# Patient Record
Sex: Female | Born: 1990
Health system: Southern US, Community
[De-identification: ages and names within clinical notes are randomized; demographics above are authoritative.]

## PROBLEM LIST (undated history)

## (undated) DIAGNOSIS — D649 Anemia, unspecified: Secondary | ICD-10-CM

## (undated) DIAGNOSIS — N840 Polyp of corpus uteri: Secondary | ICD-10-CM

## (undated) DIAGNOSIS — N921 Excessive and frequent menstruation with irregular cycle: Secondary | ICD-10-CM

## (undated) HISTORY — DX: Anemia, unspecified: D64.9

## (undated) HISTORY — PX: TONSILLECTOMY: SUR1361

---
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
## 2015-09-21 DIAGNOSIS — R1013 Epigastric pain: Secondary | ICD-10-CM | POA: Insufficient documentation

## 2015-09-21 DIAGNOSIS — R140 Abdominal distension (gaseous): Secondary | ICD-10-CM | POA: Insufficient documentation

## 2015-09-21 DIAGNOSIS — N920 Excessive and frequent menstruation with regular cycle: Secondary | ICD-10-CM | POA: Insufficient documentation

## 2016-04-23 ENCOUNTER — Ambulatory Visit: Payer: Self-pay | Admitting: Gynecology

## 2016-04-28 ENCOUNTER — Ambulatory Visit (INDEPENDENT_AMBULATORY_CARE_PROVIDER_SITE_OTHER): Payer: BLUE CROSS/BLUE SHIELD | Admitting: Gynecology

## 2016-04-28 ENCOUNTER — Encounter: Payer: Self-pay | Admitting: Gynecology

## 2016-04-28 VITALS — BP 116/72 | Ht 61.0 in | Wt 156.0 lb

## 2016-04-28 DIAGNOSIS — N920 Excessive and frequent menstruation with regular cycle: Secondary | ICD-10-CM | POA: Diagnosis not present

## 2016-04-28 DIAGNOSIS — N760 Acute vaginitis: Secondary | ICD-10-CM | POA: Diagnosis not present

## 2016-04-28 DIAGNOSIS — N898 Other specified noninflammatory disorders of vagina: Secondary | ICD-10-CM | POA: Diagnosis not present

## 2016-04-28 DIAGNOSIS — B9689 Other specified bacterial agents as the cause of diseases classified elsewhere: Secondary | ICD-10-CM

## 2016-04-28 DIAGNOSIS — Z113 Encounter for screening for infections with a predominantly sexual mode of transmission: Secondary | ICD-10-CM

## 2016-04-28 DIAGNOSIS — N923 Ovulation bleeding: Secondary | ICD-10-CM | POA: Insufficient documentation

## 2016-04-28 DIAGNOSIS — Z01411 Encounter for gynecological examination (general) (routine) with abnormal findings: Secondary | ICD-10-CM | POA: Diagnosis not present

## 2016-04-28 DIAGNOSIS — Z23 Encounter for immunization: Secondary | ICD-10-CM

## 2016-04-28 LAB — CBC WITH DIFFERENTIAL/PLATELET
BASOS PCT: 0 %
Basophils Absolute: 0 cells/uL (ref 0–200)
EOS PCT: 5 %
Eosinophils Absolute: 375 cells/uL (ref 15–500)
HCT: 35.3 % (ref 35.0–45.0)
Hemoglobin: 11.4 g/dL — ABNORMAL LOW (ref 11.7–15.5)
Lymphocytes Relative: 30 %
Lymphs Abs: 2250 cells/uL (ref 850–3900)
MCH: 25.2 pg — ABNORMAL LOW (ref 27.0–33.0)
MCHC: 32.3 g/dL (ref 32.0–36.0)
MCV: 77.9 fL — ABNORMAL LOW (ref 80.0–100.0)
MONOS PCT: 9 %
MPV: 9.4 fL (ref 7.5–12.5)
Monocytes Absolute: 675 cells/uL (ref 200–950)
NEUTROS ABS: 4200 {cells}/uL (ref 1500–7800)
Neutrophils Relative %: 56 %
PLATELETS: 405 10*3/uL — AB (ref 140–400)
RBC: 4.53 MIL/uL (ref 3.80–5.10)
RDW: 15.2 % — ABNORMAL HIGH (ref 11.0–15.0)
WBC: 7.5 10*3/uL (ref 3.8–10.8)

## 2016-04-28 LAB — WET PREP FOR TRICH, YEAST, CLUE
Trich, Wet Prep: NONE SEEN
YEAST WET PREP: NONE SEEN

## 2016-04-28 LAB — PREGNANCY, URINE: PREG TEST UR: NEGATIVE

## 2016-04-28 LAB — CHOLESTEROL, TOTAL: CHOLESTEROL: 196 mg/dL (ref 125–200)

## 2016-04-28 MED ORDER — TINIDAZOLE 500 MG PO TABS: ORAL_TABLET | ORAL | 0 refills | Status: DC

## 2016-04-28 NOTE — Patient Instructions (Addendum)
Influenza Virus Vaccine (Flucelvax) What is this medicine? INFLUENZA VIRUS VACCINE (in floo EN zuh VAHY ruhs vak SEEN) helps to reduce the risk of getting influenza also known as the flu. The vaccine only helps protect you against some strains of the flu. This medicine may be used for other purposes; ask your health care provider or pharmacist if you have questions. What should I tell my health care provider before I take this medicine? They need to know if you have any of these conditions: -bleeding disorder like hemophilia -fever or infection -Guillain-Barre syndrome or other neurological problems -immune system problems -infection with the human immunodeficiency virus (HIV) or AIDS -low blood platelet counts -multiple sclerosis -an unusual or allergic reaction to influenza virus vaccine, other medicines, foods, dyes or preservatives -pregnant or trying to get pregnant -breast-feeding How should I use this medicine? This vaccine is for injection into a muscle. It is given by a health care professional. A copy of Vaccine Information Statements will be given before each vaccination. Read this sheet carefully each time. The sheet may change frequently. Talk to your pediatrician regarding the use of this medicine in children. Special care may be needed. Overdosage: If you think you've taken too much of this medicine contact a poison control center or emergency room at once. Overdosage: If you think you have taken too much of this medicine contact a poison control center or emergency room at once. NOTE: This medicine is only for you. Do not share this medicine with others. What if I miss a dose? This does not apply. What may interact with this medicine? -chemotherapy or radiation therapy -medicines that lower your immune system like etanercept, anakinra, infliximab, and adalimumab -medicines that treat or prevent blood clots like warfarin -phenytoin -steroid medicines like prednisone or  cortisone -theophylline -vaccines This list may not describe all possible interactions. Give your health care provider a list of all the medicines, herbs, non-prescription drugs, or dietary supplements you use. Also tell them if you smoke, drink alcohol, or use illegal drugs. Some items may interact with your medicine. What should I watch for while using this medicine? Report any side effects that do not go away within 3 days to your doctor or health care professional. Call your health care provider if any unusual symptoms occur within 6 weeks of receiving this vaccine. You may still catch the flu, but the illness is not usually as bad. You cannot get the flu from the vaccine. The vaccine will not protect against colds or other illnesses that may cause fever. The vaccine is needed every year. What side effects may I notice from receiving this medicine? Side effects that you should report to your doctor or health care professional as soon as possible: -allergic reactions like skin rash, itching or hives, swelling of the face, lips, or tongue Side effects that usually do not require medical attention (Report these to your doctor or health care professional if they continue or are bothersome.): -fever -headache -muscle aches and pains -pain, tenderness, redness, or swelling at the injection site -tiredness This list may not describe all possible side effects. Call your doctor for medical advice about side effects. You may report side effects to FDA at 1-800-FDA-1088. Where should I keep my medicine? The vaccine will be given by a health care professional in a clinic, pharmacy, doctor's office, or other health care setting. You will not be given vaccine doses to store at home. NOTE: This sheet is a summary. It may not cover   all possible information. If you have questions about this medicine, talk to your doctor, pharmacist, or health care provider.    2016, Elsevier/Gold Standard. (2011-06-11  14:06:47)  Tinidazole tablets What is this medicine? TINIDAZOLE (tye NI da zole) is an antiinfective. It is used to treat amebiasis, giardiasis, trichomoniasis, and vaginosis. It will not work for colds, flu, or other viral infections. This medicine may be used for other purposes; ask your health care provider or pharmacist if you have questions. What should I tell my health care provider before I take this medicine? They need to know if you have any of these conditions: -anemia or other blood disorders -if you frequently drink alcohol containing drinks -receiving hemodialysis -seizure disorder -an unusual or allergic reaction to tinidazole, other medicines, foods, dyes, or preservatives -pregnant or trying to get pregnant -breast-feeding How should I use this medicine? Take this medicine by mouth with a full glass of water. Follow the directions on the prescription label. Take with food. Take your medicine at regular intervals. Do not take your medicine more often than directed. Take all of your medicine as directed even if you think you are better. Do not skip doses or stop your medicine early. Talk to your pediatrician regarding the use of this medicine in children. While this drug may be prescribed for children as young as 17 years of age for selected conditions, precautions do apply. Overdosage: If you think you have taken too much of this medicine contact a poison control center or emergency room at once. NOTE: This medicine is only for you. Do not share this medicine with others. What if I miss a dose? If you miss a dose, take it as soon as you can. If it is almost time for your next dose, take only that dose. Do not take double or extra doses. What may interact with this medicine? Do not take this medicine with any of the following medications: -alcohol or any product that contains alcohol -amprenavir oral solution -disulfiram -paclitaxel injection -ritonavir oral  solution -sertraline oral solution -sulfamethoxazole-trimethoprim injection This medicine may also interact with the following medications: -cholestyramine -cimetidine -conivaptan -cyclosporin -fluorouracil -fosphenytoin, phenytoin -ketoconazole -lithium -phenobarbital -tacrolimus -warfarin This list may not describe all possible interactions. Give your health care provider a list of all the medicines, herbs, non-prescription drugs, or dietary supplements you use. Also tell them if you smoke, drink alcohol, or use illegal drugs. Some items may interact with your medicine. What should I watch for while using this medicine? Tell your doctor or health care professional if your symptoms do not improve or if they get worse. Avoid alcoholic drinks while you are taking this medicine and for three days afterward. Alcohol may make you feel dizzy, sick, or flushed. If you are being treated for a sexually transmitted disease, avoid sexual contact until you have finished your treatment. Your sexual partner may also need treatment. What side effects may I notice from receiving this medicine? Side effects that you should report to your doctor or health care professional as soon as possible: -allergic reactions like skin rash, itching or hives, swelling of the face, lips, or tongue -breathing problems -confusion, depression -dark or white patches in the mouth -feeling faint or lightheaded, falls -fever, infection -numbness, tingling, pain or weakness in the hands or feet -pain when passing urine -seizures -unusually weak or tired -vaginal irritation or discharge -vomiting Side effects that usually do not require medical attention (report to your doctor or health care professional if they  continue or are bothersome): -dark brown or reddish urine -diarrhea -headache -loss of appetite -metallic taste -nausea -stomach upset This list may not describe all possible side effects. Call your doctor  for medical advice about side effects. You may report side effects to FDA at 1-800-FDA-1088. Where should I keep my medicine? Keep out of the reach of children. Store at room temperature between 15 and 30 degrees C (59 and 86 degrees F). Protect from light and moisture. Keep container tightly closed. Throw away any unused medicine after the expiration date. NOTE: This sheet is a summary. It may not cover all possible information. If you have questions about this medicine, talk to your doctor, pharmacist, or health care provider.    2016, Elsevier/Gold Standard. (2008-03-27 15:22:28)  Bacterial Vaginosis Bacterial vaginosis is a vaginal infection that occurs when the normal balance of bacteria in the vagina is disrupted. It results from an overgrowth of certain bacteria. This is the most common vaginal infection in women of childbearing age. Treatment is important to prevent complications, especially in pregnant women, as it can cause a premature delivery. CAUSES  Bacterial vaginosis is caused by an increase in harmful bacteria that are normally present in smaller amounts in the vagina. Several different kinds of bacteria can cause bacterial vaginosis. However, the reason that the condition develops is not fully understood. RISK FACTORS Certain activities or behaviors can put you at an increased risk of developing bacterial vaginosis, including:  Having a new sex partner or multiple sex partners.  Douching.  Using an intrauterine device (IUD) for contraception. Women do not get bacterial vaginosis from toilet seats, bedding, swimming pools, or contact with objects around them. SIGNS AND SYMPTOMS  Some women with bacterial vaginosis have no signs or symptoms. Common symptoms include:  Grey vaginal discharge.  A fishlike odor with discharge, especially after sexual intercourse.  Itching or burning of the vagina and vulva.  Burning or pain with urination. DIAGNOSIS  Your health care  provider will take a medical history and examine the vagina for signs of bacterial vaginosis. A sample of vaginal fluid may be taken. Your health care provider will look at this sample under a microscope to check for bacteria and abnormal cells. A vaginal pH test may also be done.  TREATMENT  Bacterial vaginosis may be treated with antibiotic medicines. These may be given in the form of a pill or a vaginal cream. A second round of antibiotics may be prescribed if the condition comes back after treatment. Because bacterial vaginosis increases your risk for sexually transmitted diseases, getting treated can help reduce your risk for chlamydia, gonorrhea, HIV, and herpes. HOME CARE INSTRUCTIONS   Only take over-the-counter or prescription medicines as directed by your health care provider.  If antibiotic medicine was prescribed, take it as directed. Make sure you finish it even if you start to feel better.  Tell all sexual partners that you have a vaginal infection. They should see their health care provider and be treated if they have problems, such as a mild rash or itching.  During treatment, it is important that you follow these instructions:  Avoid sexual activity or use condoms correctly.  Do not douche.  Avoid alcohol as directed by your health care provider.  Avoid breastfeeding as directed by your health care provider. SEEK MEDICAL CARE IF:   Your symptoms are not improving after 3 days of treatment.  You have increased discharge or pain.  You have a fever. MAKE SURE YOU:  Understand these instructions.  Will watch your condition.  Will get help right away if you are not doing well or get worse. FOR MORE INFORMATION  Centers for Disease Control and Prevention, Division of STD Prevention: [employer]raiser[employer]raud[employer] American Sexual Health Association (ASHA): www.ashastd.org    This information is not intended to replace advice given to you by your health care provider. Make sure you  discuss any questions you have with your health care provider.   Document Released: 06/30/2005 Document Revised: 07/21/2014 Document Reviewed: 02/09/2013 Elsevier Interactive Patient Education [employer].

## 2016-04-28 NOTE — Progress Notes (Signed)
Dana Manning 1990/09/18 [id number]   History:    25 y.o.  for annual gyn exam who is a new patient to the practice. Patient stated that she was being followed in the past and another practice. She has been complaining of intermenstrual bleeding. She has been using only condoms for contraception. She has not been sexually active in over a month. She denies any nausea, vomiting, slight breast tenderness. Patient had a new partner month ago but not sexually active would like to have an STD screen. She does state she completed many years ago the HPV vaccine. Patient reports no past history of any abnormal Pap smear.  Past medical history,surgical history, family history and social history were all reviewed and documented in the EPIC chart.  Gynecologic History [name] was 04/01/2016. Contraception: condoms Last Pap: Several years ago no records. Results were: normal Last mammogram: Not indicated. Results were: Not indicated  Obstetric History OB History  Gravida Para Term Preterm AB Living  0 0 0 0 0 0  SAB TAB Ectopic Multiple Live Births  0 0 0 0 0         ROS: A ROS was performed and pertinent positives and negatives are included in the history.  GENERAL: No fevers or chills. HEENT: No change in vision, no earache, sore throat or sinus congestion. NECK: No pain or stiffness. CARDIOVASCULAR: No chest pain or pressure. No palpitations. PULMONARY: No shortness of breath, cough or wheeze. GASTROINTESTINAL: No abdominal pain, nausea, vomiting or diarrhea, melena or bright red blood per rectum. GENITOURINARY: No urinary frequency, urgency, hesitancy or dysuria. MUSCULOSKELETAL: No joint or muscle pain, no back pain, no recent trauma. DERMATOLOGIC: No rash, no itching, no lesions. ENDOCRINE: No polyuria, polydipsia, no heat or cold intolerance. No recent change in weight. HEMATOLOGICAL: No anemia or easy bruising or bleeding. NEUROLOGIC: No headache, seizures,  numbness, tingling or weakness. PSYCHIATRIC: No depression, no loss of interest in normal activity or change in sleep pattern.     Exam: chaperone present  BP 116/72   Ht 5\' 1"  (1.549 m)   Wt 156 lb (70.8 kg)   LMP 04/01/2016   BMI 29.48 kg/m   Body mass index is 29.48 kg/m.  General appearance : Well developed well nourished female. No acute distress HEENT: Eyes: no retinal hemorrhage or exudates,  Neck supple, trachea midline, no carotid bruits, no thyroidmegaly Lungs: Clear to auscultation, no rhonchi or wheezes, or rib retractions  Heart: Regular rate and rhythm, no murmurs or gallops Breast:Examined in sitting and supine position were symmetrical in appearance, no palpable masses or tenderness,  no skin retraction, no nipple inversion, no nipple discharge, no skin discoloration, no axillary or supraclavicular lymphadenopathy Abdomen: no palpable masses or tenderness, no rebound or guarding Extremities: no edema or skin discoloration or tenderness  Pelvic:  Bartholin, Urethra, Skene Glands: Within normal limits             Vagina: No gross lesions or discharge, thick white foul-smelling odor discharge  Cervix: No gross lesions or discharge  Uterus  anteverted, normal size, shape and consistency, non-tender and mobile  Adnexa  Without masses or tenderness  Anus and perineum  normal   Rectovaginal  normal sphincter tone without palpated masses or tenderness             Hemoccult not indicated   Wet prep: Few clue cells, too numerous to count white blood cell and many bacteria  GC committed culture pending  Urine  pregnancy test negative  Assessment/Plan:  25 y.o. female for annual exam with history of intermenstrual bleeding will be schedule sometime late next week for sonohysterogram. Clinical evidence of bacterial vaginosis will be treated with Tindamax 500 mg tablets. She was instructed to take 4 tablets today repeat in 24 hours. As part of her STD screen the following was  ordered: GC and Chlamydia culture, HIV, RPR, hepatitis B and C. Also a CBC screening cholesterol and urinalysis was obtained today. Patient was counseled and received the flu vaccine today.   Terrance Mass MD, 10:08 AM 04/28/2016

## 2016-04-29 ENCOUNTER — Encounter: Payer: Self-pay | Admitting: Gynecology

## 2016-04-29 ENCOUNTER — Telehealth: Payer: Self-pay | Admitting: *Deleted

## 2016-04-29 LAB — URINALYSIS W MICROSCOPIC + REFLEX CULTURE
BACTERIA UA: NONE SEEN [HPF]
BILIRUBIN URINE: NEGATIVE
Casts: NONE SEEN [LPF]
Crystals: NONE SEEN [HPF]
GLUCOSE, UA: NEGATIVE
KETONES UR: NEGATIVE
Nitrite: NEGATIVE
PROTEIN: NEGATIVE
Specific Gravity, Urine: 1.026 (ref 1.001–1.035)
Yeast: NONE SEEN [HPF]
pH: 6.5 (ref 5.0–8.0)

## 2016-04-29 LAB — PAP IG W/ RFLX HPV ASCU

## 2016-04-29 LAB — HEPATITIS B SURFACE ANTIGEN: HEP B S AG: NEGATIVE

## 2016-04-29 LAB — GC/CHLAMYDIA PROBE AMP
CT Probe RNA: NOT DETECTED
GC Probe RNA: NOT DETECTED

## 2016-04-29 LAB — HIV ANTIBODY (ROUTINE TESTING W REFLEX): HIV 1&2 Ab, 4th Generation: NONREACTIVE

## 2016-04-29 LAB — RPR

## 2016-04-29 LAB — HEPATITIS C ANTIBODY: HCV AB: NEGATIVE

## 2016-04-29 NOTE — Telephone Encounter (Signed)
Pt informed with normal STD screening on 04/28/16.

## 2016-04-30 ENCOUNTER — Telehealth: Payer: Self-pay | Admitting: *Deleted

## 2016-04-30 ENCOUNTER — Other Ambulatory Visit: Payer: Self-pay | Admitting: *Deleted

## 2016-04-30 DIAGNOSIS — N923 Ovulation bleeding: Secondary | ICD-10-CM

## 2016-04-30 LAB — URINE CULTURE

## 2016-04-30 NOTE — Telephone Encounter (Signed)
Per Mitzi Hansen at [facility] ref call [phone] [facility] covered with $5 copay KW

## 2016-05-01 ENCOUNTER — Ambulatory Visit: Payer: Self-pay | Admitting: Gynecology

## 2016-05-08 ENCOUNTER — Other Ambulatory Visit: Payer: Self-pay | Admitting: Gynecology

## 2016-05-08 DIAGNOSIS — N939 Abnormal uterine and vaginal bleeding, unspecified: Secondary | ICD-10-CM

## 2016-05-14 ENCOUNTER — Encounter: Payer: Self-pay | Admitting: Gynecology

## 2016-05-14 ENCOUNTER — Ambulatory Visit (INDEPENDENT_AMBULATORY_CARE_PROVIDER_SITE_OTHER): Payer: BLUE CROSS/BLUE SHIELD

## 2016-05-14 ENCOUNTER — Other Ambulatory Visit: Payer: Self-pay | Admitting: Gynecology

## 2016-05-14 ENCOUNTER — Ambulatory Visit (INDEPENDENT_AMBULATORY_CARE_PROVIDER_SITE_OTHER): Payer: BLUE CROSS/BLUE SHIELD | Admitting: Gynecology

## 2016-05-14 VITALS — BP 110/70 | HR 70

## 2016-05-14 DIAGNOSIS — N923 Ovulation bleeding: Secondary | ICD-10-CM

## 2016-05-14 DIAGNOSIS — R938 Abnormal findings on diagnostic imaging of other specified body structures: Secondary | ICD-10-CM

## 2016-05-14 DIAGNOSIS — D251 Intramural leiomyoma of uterus: Secondary | ICD-10-CM

## 2016-05-14 DIAGNOSIS — N840 Polyp of corpus uteri: Secondary | ICD-10-CM

## 2016-05-14 DIAGNOSIS — R9389 Abnormal findings on diagnostic imaging of other specified body structures: Secondary | ICD-10-CM

## 2016-05-14 DIAGNOSIS — N939 Abnormal uterine and vaginal bleeding, unspecified: Secondary | ICD-10-CM | POA: Diagnosis not present

## 2016-05-14 DIAGNOSIS — Z01818 Encounter for other preprocedural examination: Secondary | ICD-10-CM | POA: Diagnosis not present

## 2016-05-14 DIAGNOSIS — N920 Excessive and frequent menstruation with regular cycle: Secondary | ICD-10-CM

## 2016-05-14 DIAGNOSIS — N9489 Other specified conditions associated with female genital organs and menstrual cycle: Secondary | ICD-10-CM

## 2016-05-14 NOTE — Progress Notes (Signed)
Dana Manning is an 25 y.o. female gravida 0 para 0 that was seen for the first time as a new patient to the practice on 04/28/2016. Patient been complaining intermenstrual bleeding for several months. She was only using condoms for contraception to had not been sexually active for a month. She is complaining of slight vaginal discharge and was treated for bacterial vaginosis currently asymptomatic. Her GC and Chlamydia culture along with HIV, RPR, hepatitis B and C as part STD screening was negative. She is here today for preoperative consultation as well as sonohysterogram.  Ultrasound/sono hysterogram today: Uterus measured 8.7 x 6.3 x 4.4 cm within a major stripe of 9 mm. Prominent echogenic focus measuring 27 x 7 mm with positive color flow was noted. A small intramural fibroid measuring 12 x 9 mm was noted. Right and left ovary were normal. There was some fluid in the cul-de-sac. The cervix was cleansed Betadine solution a sterile catheter was introduced into the uterine cavity and a 16 mm anterior along with 14 x 5 mm posterior and 14 x 6 mm anterior endometrial polyps were noted.  Pertinent Gynecological History: Menses: Intermenstrual spotting Bleeding: intermenstrual bleeding Contraception: none DES exposure: unknown Blood transfusions: none Sexually transmitted diseases: Chlamydia Previous GYN Procedures: none  Last mammogram: Not indicated Date: Not indicated Last pap: normal Date: 2017 OB History: G 0, P 0   Menstrual History: Menarche age: 25 [name] was 05/06/2016.    Past Medical History:  Diagnosis Date  . Anemia     Past Surgical History:  Procedure Laterality Date  . APPENDECTOMY    . TONSILLECTOMY      Family History  Problem Relation Age of Onset  . Cancer Maternal Grandmother     SMALL INTESTINE-   . Cancer Maternal Grandfather     PROSTATE- LYMPHOMA    Social History:  reports that she has never smoked. She has never used  smokeless tobacco. She reports that she does not drink alcohol or use drugs.  Allergies: Not on File   (Not in a hospital admission)  REVIEW OF SYSTEMS: A ROS was performed and pertinent positives and negatives are included in the history.  GENERAL: No fevers or chills. HEENT: No change in vision, no earache, sore throat or sinus congestion. NECK: No pain or stiffness. CARDIOVASCULAR: No chest pain or pressure. No palpitations. PULMONARY: No shortness of breath, cough or wheeze. GASTROINTESTINAL: No abdominal pain, nausea, vomiting or diarrhea, melena or bright red blood per rectum. GENITOURINARY: No urinary frequency, urgency, hesitancy or dysuria. MUSCULOSKELETAL: No joint or muscle pain, no back pain, no recent trauma. DERMATOLOGIC: No rash, no itching, no lesions. ENDOCRINE: No polyuria, polydipsia, no heat or cold intolerance. No recent change in weight. HEMATOLOGICAL: No anemia or easy bruising or bleeding. NEUROLOGIC: No headache, seizures, numbness, tingling or weakness. PSYCHIATRIC: No depression, no loss of interest in normal activity or change in sleep pattern.     Last menstrual period 05/06/2016.  Physical Exam:  HEENT:unremarkable Neck:Supple, midline, no thyroid megaly, no carotid bruits Lungs:  Clear to auscultation no rhonchi's or wheezes Heart:Regular rate and rhythm, no murmurs or gallops Breast Exam: Exam and on 05/08/2016 see previous note Abdomen: See previous note Pelvic:BUS see previous note Vagina: See previous note Cervix: See previous note Uterus: See previous note Adnexa: See previous note Extremities: No cords, no edema Rectal: See previous note     Assessment/Plan: 25 year old with several months history of intermenstrual bleeding will be scheduled to undergo  resectoscopic polypectomy to remove the recently detected endometrial polyps. Endometrial biopsy was done today tissue submitted for histological evaluation results pending at time of this  dictation. The risks benefits and pros and cons with the surgery were discussed with patient as follows:                        Patient was counseled as to the risk of surgery to include the following:  1. Infection (prohylactic antibiotics will be administered)  2. DVT/Pulmonary Embolism (prophylactic pneumo compression stockings will be used)  3.Trauma to internal organs requiring additional surgical procedure to repair any injury to     Internal organs requiring perhaps additional hospitalization days.  4.Hemmorhage requiring transfusion and blood products which carry risks such as  anaphylactic reaction, hepatitis and AIDS  Patient had received literature information on the procedure scheduled and all her questions were answered and fully accepts all risk.   [facility] HMD9:02 AMTD     Dana Manning 05/14/2016, 8:57 AM

## 2016-05-14 NOTE — Patient Instructions (Signed)
Hysteroscopy °Hysteroscopy is a procedure used for looking inside the womb (uterus). It may be done for various reasons, including: °· To evaluate abnormal bleeding, fibroid (benign, noncancerous) tumors, polyps, scar tissue (adhesions), and possibly cancer of the uterus. °· To look for lumps (tumors) and other uterine growths. °· To look for causes of why a woman cannot get pregnant (infertility), causes of recurrent loss of pregnancy (miscarriages), or a lost intrauterine device (IUD). °· To perform a sterilization by blocking the fallopian tubes from inside the uterus. °In this procedure, a thin, flexible tube with a tiny light and camera on the end of it (hysteroscope) is used to look inside the uterus. A hysteroscopy should be done right after a menstrual period to be sure you are not pregnant. °LET YOUR HEALTH CARE PROVIDER KNOW ABOUT:  °· Any allergies you have. °· All medicines you are taking, including vitamins, herbs, eye drops, creams, and over-the-counter medicines. °· Previous problems you or members of your family have had with the use of anesthetics. °· Any blood disorders you have. °· Previous surgeries you have had. °· Medical conditions you have. °RISKS AND COMPLICATIONS  °Generally, this is a safe procedure. However, as with any procedure, complications can occur. Possible complications include: °· Putting a hole in the uterus. °· Excessive bleeding. °· Infection. °· Damage to the cervix. °· Injury to other organs. °· Allergic reaction to medicines. °· Too much fluid used in the uterus for the procedure. °BEFORE THE PROCEDURE  °· Ask your health care provider about changing or stopping any regular medicines. °· Do not take aspirin or blood thinners for 1 week before the procedure, or as directed by your health care provider. These can cause bleeding. °· If you smoke, do not smoke for 2 weeks before the procedure. °· In some cases, a medicine is placed in the cervix the day before the procedure.  This medicine makes the cervix have a larger opening (dilate). This makes it easier for the instrument to be inserted into the uterus during the procedure. °· Do not eat or drink anything for at least 8 hours before the surgery. °· Arrange for someone to take you home after the procedure. °PROCEDURE  °· You may be given a medicine to relax you (sedative). You may also be given one of the following: °¨ A medicine that numbs the area around the cervix (local anesthetic). °¨ A medicine that makes you sleep through the procedure (general anesthetic). °· The hysteroscope is inserted through the vagina into the uterus. The camera on the hysteroscope sends a picture to a TV screen. This gives the surgeon a good view inside the uterus. °· During the procedure, air or a liquid is put into the uterus, which allows the surgeon to see better. °· Sometimes, tissue is gently scraped from inside the uterus. These tissue samples are sent to a lab for testing. °AFTER THE PROCEDURE  °· If you had a general anesthetic, you may be groggy for a couple hours after the procedure. °· If you had a local anesthetic, you will be able to go home as soon as you are stable and feel ready. °· You may have some cramping. This normally lasts for a couple days. °· You may have bleeding, which varies from light spotting for a few days to menstrual-like bleeding for 3-7 days. This is normal. °· If your test results are not back during the visit, make an appointment with your health care provider to find out the   results.   This information is not intended to replace advice given to you by your health care provider. Make sure you discuss any questions you have with your health care provider.   Document Released: 10/06/2000 Document Revised: 04/20/2013 Document Reviewed: 01/27/2013 Elsevier Interactive Patient Education [employer].

## 2016-05-15 ENCOUNTER — Telehealth: Payer: Self-pay

## 2016-05-15 NOTE — Telephone Encounter (Signed)
I called patient and left message to call me regarding scheduling out patient surgery. I left first available dates for her consideration.

## 2016-05-15 NOTE — Telephone Encounter (Signed)
Patient called back and left desired date. I scheduled her for 06/20/16 as she needed a Friday. I left message with date and surgery prepymt amount but told her I would still like to talk with her and will try again tomorrow.

## 2016-05-16 ENCOUNTER — Telehealth: Payer: Self-pay

## 2016-05-16 NOTE — Telephone Encounter (Signed)
I called patient. I was able to speak with her and review surgery date and ins benefits/estimated [employer] surgery prepayment due by one week prior to surgery. She knows [facility] will be calling with instructions.

## 2016-05-16 NOTE — Telephone Encounter (Signed)
I called patient and left message that I was calling to talk with her about scheduling outpatient surgery. I also wanted to review her ins benefits/surgery prepayment. I left available dates for her to check on.   She called me back and left message with date preference and I scheduled her accordingly. No pre op appt with Dr.JF needed per his order slip. I called patient back and again left a message that I scheduled surgery  For 06/20/16 at 9:00am at [facility].  I did ask her to call me so we can review details.

## 2016-05-20 ENCOUNTER — Encounter: Payer: Self-pay | Admitting: Anesthesiology

## 2016-06-02 ENCOUNTER — Encounter (HOSPITAL_COMMUNITY): Payer: Self-pay | Admitting: *Deleted

## 2016-06-19 MED ORDER — DEXTROSE 5 % IV SOLN
2.0000 g | INTRAVENOUS | Status: AC
  Administered 2016-06-20: 2 g via INTRAVENOUS
  Filled 2016-06-19: qty 2

## 2016-06-19 NOTE — H&P (Signed)
Dana Manning is an 25 y.o. female gravida 0 para 0 that was seen for the first time as a new patient to the practice on 04/28/2016. Patient been complaining intermenstrual bleeding for several months. She was only using condoms for contraception to had not been sexually active for a month. She is complaining of slight vaginal discharge and was treated for bacterial vaginosis currently asymptomatic. Her GC and Chlamydia culture along with HIV, RPR, hepatitis B and C as part STD screening was negative. She is here today for preoperative consultation as well as sonohysterogram.  Ultrasound/sono hysterogram today: Uterus measured 8.7 x 6.3 x 4.4 cm within a major stripe of 9 mm. Prominent echogenic focus measuring 27 x 7 mm with positive color flow was noted. A small intramural fibroid measuring 12 x 9 mm was noted. Right and left ovary were normal. There was some fluid in the cul-de-sac. The cervix was cleansed Betadine solution a sterile catheter was introduced into the uterine cavity and a 16 mm anterior along with 14 x 5 mm posterior and 14 x 6 mm anterior endometrial polyps were noted.  Pertinent Gynecological History: Menses: Intermenstrual spotting Bleeding: intermenstrual bleeding Contraception: none DES exposure: unknown Blood transfusions: none Sexually transmitted diseases: Chlamydia Previous GYN Procedures: none  Last mammogram: Not indicated Date: Not indicated Last pap: normal Date: 2017 OB History: G 0, P 0     Menstrual History: Menarche age: 69 [name] was 05/06/2016.        Past Medical History:  Diagnosis Date  . Anemia          Past Surgical History:  Procedure Laterality Date  . APPENDECTOMY    . TONSILLECTOMY            Family History  Problem Relation Age of Onset  . Cancer Maternal Grandmother     SMALL INTESTINE-   . Cancer Maternal Grandfather     PROSTATE- LYMPHOMA    Social History:  reports that  she has never smoked. She has never used smokeless tobacco. She reports that she does not drink alcohol or use drugs.  Allergies: Not on File   (Not in a hospital admission)  REVIEW OF SYSTEMS: A ROS was performed and pertinent positives and negatives are included in the history.  GENERAL: No fevers or chills. HEENT: No change in vision, no earache, sore throat or sinus congestion. NECK: No pain or stiffness. CARDIOVASCULAR: No chest pain or pressure. No palpitations. PULMONARY: No shortness of breath, cough or wheeze. GASTROINTESTINAL: No abdominal pain, nausea, vomiting or diarrhea, melena or bright red blood per rectum. GENITOURINARY: No urinary frequency, urgency, hesitancy or dysuria. MUSCULOSKELETAL: No joint or muscle pain, no back pain, no recent trauma. DERMATOLOGIC: No rash, no itching, no lesions. ENDOCRINE: No polyuria, polydipsia, no heat or cold intolerance. No recent change in weight. HEMATOLOGICAL: No anemia or easy bruising or bleeding. NEUROLOGIC: No headache, seizures, numbness, tingling or weakness. PSYCHIATRIC: No depression, no loss of interest in normal activity or change in sleep pattern.     Last menstrual period 05/06/2016.  Physical Exam:  HEENT:unremarkable Neck:Supple, midline, no thyroid megaly, no carotid bruits Lungs:  Clear to auscultation no rhonchi's or wheezes Heart:Regular rate and rhythm, no murmurs or gallops Breast Exam: Exam and on 05/08/2016 see previous note Abdomen: See previous note Pelvic:BUS see previous note Vagina: See previous note Cervix: See previous note Uterus: See previous note Adnexa: See previous note Extremities: No cords, no edema Rectal: See previous note  Assessment/Plan: 25 year old with several months history of intermenstrual bleeding will be scheduled to undergo resectoscopic polypectomy to remove the recently detected endometrial polyps. Endometrial biopsy was done today tissue submitted for  histological evaluation results pending at time of this dictation. The risks benefits and pros and cons with the surgery were discussed with patient as follows:                        Patient was counseled as to the risk of surgery to include the following:  1. Infection (prohylactic antibiotics will be administered)  2. DVT/Pulmonary Embolism (prophylactic pneumo compression stockings will be used)  3.Trauma to internal organs requiring additional surgical procedure to repair any injury to     Internal organs requiring perhaps additional hospitalization days.  4.Hemmorhage requiring transfusion and blood products which carry risks such as  anaphylactic reaction, hepatitis and AIDS  Patient had received literature information on the procedure scheduled and all her questions were answered and fully accepts all risk.   [facility] HMD9:02 AMTD

## 2016-06-20 ENCOUNTER — Encounter (HOSPITAL_COMMUNITY): Payer: Self-pay | Admitting: Anesthesiology

## 2016-06-20 ENCOUNTER — Ambulatory Visit (HOSPITAL_COMMUNITY): Payer: BLUE CROSS/BLUE SHIELD | Admitting: Anesthesiology

## 2016-06-20 ENCOUNTER — Encounter (HOSPITAL_COMMUNITY)
Admission: RE | Disposition: A | Discharge status: Home / Self Care | Payer: Self-pay | Source: Ambulatory Visit | Attending: Gynecology

## 2016-06-20 ENCOUNTER — Ambulatory Visit (HOSPITAL_COMMUNITY)
Admission: RE | Admit: 2016-06-20 | Discharge: 2016-06-20 | Disposition: A | Discharge status: Home / Self Care | Payer: BLUE CROSS/BLUE SHIELD | Source: Ambulatory Visit | Attending: Gynecology | Admitting: Gynecology

## 2016-06-20 DIAGNOSIS — N938 Other specified abnormal uterine and vaginal bleeding: Secondary | ICD-10-CM | POA: Insufficient documentation

## 2016-06-20 DIAGNOSIS — N840 Polyp of corpus uteri: Secondary | ICD-10-CM | POA: Insufficient documentation

## 2016-06-20 HISTORY — PX: DILATATION & CURETTAGE/HYSTEROSCOPY WITH MYOSURE: SHX6511

## 2016-06-20 LAB — CBC
HCT: 33.8 % — ABNORMAL LOW (ref 36.0–46.0)
HEMOGLOBIN: 11.2 g/dL — AB (ref 12.0–15.0)
MCH: 26.4 pg (ref 26.0–34.0)
MCHC: 33.1 g/dL (ref 30.0–36.0)
MCV: 79.5 fL (ref 78.0–100.0)
Platelets: 406 10*3/uL — ABNORMAL HIGH (ref 150–400)
RBC: 4.25 MIL/uL (ref 3.87–5.11)
RDW: 16 % — ABNORMAL HIGH (ref 11.5–15.5)
WBC: 6 10*3/uL (ref 4.0–10.5)

## 2016-06-20 LAB — PREGNANCY, URINE: Preg Test, Ur: NEGATIVE

## 2016-06-20 SURGERY — DILATATION & CURETTAGE/HYSTEROSCOPY WITH MYOSURE
Anesthesia: General

## 2016-06-20 MED ORDER — LACTATED RINGERS IV SOLN
INTRAVENOUS | Status: DC
  Administered 2016-06-20: 125 mL/h via INTRAVENOUS
  Administered 2016-06-20: 09:00:00 via INTRAVENOUS

## 2016-06-20 MED ORDER — PROPOFOL 10 MG/ML IV BOLUS
INTRAVENOUS | Status: AC
  Filled 2016-06-20: qty 20

## 2016-06-20 MED ORDER — MIDAZOLAM HCL 2 MG/2ML IJ SOLN
INTRAMUSCULAR | Status: AC
  Filled 2016-06-20: qty 2

## 2016-06-20 MED ORDER — DEXAMETHASONE SODIUM PHOSPHATE 4 MG/ML IJ SOLN
INTRAMUSCULAR | Status: DC | PRN
  Administered 2016-06-20: 10 mg via INTRAVENOUS

## 2016-06-20 MED ORDER — KETOROLAC TROMETHAMINE 30 MG/ML IJ SOLN: 30.0000 mg | Freq: Once | INTRAMUSCULAR | Status: DC | PRN

## 2016-06-20 MED ORDER — LIDOCAINE HCL (CARDIAC) 20 MG/ML IV SOLN
INTRAVENOUS | Status: AC
  Filled 2016-06-20: qty 5

## 2016-06-20 MED ORDER — PROPOFOL 10 MG/ML IV BOLUS
INTRAVENOUS | Status: DC | PRN
  Administered 2016-06-20: 200 mg via INTRAVENOUS

## 2016-06-20 MED ORDER — HYDROMORPHONE HCL 1 MG/ML IJ SOLN
INTRAMUSCULAR | Status: AC
  Filled 2016-06-20: qty 1

## 2016-06-20 MED ORDER — MIDAZOLAM HCL 5 MG/5ML IJ SOLN
INTRAMUSCULAR | Status: DC | PRN
  Administered 2016-06-20: 2 mg via INTRAVENOUS

## 2016-06-20 MED ORDER — FENTANYL CITRATE (PF) 100 MCG/2ML IJ SOLN
INTRAMUSCULAR | Status: DC | PRN
  Administered 2016-06-20: 50 ug via INTRAVENOUS

## 2016-06-20 MED ORDER — ONDANSETRON HCL 4 MG/2ML IJ SOLN
INTRAMUSCULAR | Status: AC
  Filled 2016-06-20: qty 2

## 2016-06-20 MED ORDER — SODIUM CHLORIDE 0.9 % IR SOLN
Status: DC | PRN
  Administered 2016-06-20: 3000 mL

## 2016-06-20 MED ORDER — FENTANYL CITRATE (PF) 100 MCG/2ML IJ SOLN
INTRAMUSCULAR | Status: AC
  Filled 2016-06-20: qty 2

## 2016-06-20 MED ORDER — SCOPOLAMINE 1 MG/3DAYS TD PT72
1.0000 | MEDICATED_PATCH | Freq: Once | TRANSDERMAL | Status: DC
  Administered 2016-06-20: 1.5 mg via TRANSDERMAL

## 2016-06-20 MED ORDER — ONDANSETRON HCL 4 MG/2ML IJ SOLN
INTRAMUSCULAR | Status: DC | PRN
  Administered 2016-06-20: 4 mg via INTRAVENOUS

## 2016-06-20 MED ORDER — SILVER NITRATE-POT NITRATE 75-25 % EX MISC
CUTANEOUS | Status: DC | PRN
  Administered 2016-06-20: 1

## 2016-06-20 MED ORDER — HYDROMORPHONE HCL 1 MG/ML IJ SOLN
0.2500 mg | INTRAMUSCULAR | Status: DC | PRN
  Administered 2016-06-20 (×2): 0.25 mg via INTRAVENOUS

## 2016-06-20 MED ORDER — SCOPOLAMINE 1 MG/3DAYS TD PT72
MEDICATED_PATCH | TRANSDERMAL | Status: AC
  Administered 2016-06-20: 1.5 mg via TRANSDERMAL
  Filled 2016-06-20: qty 1

## 2016-06-20 MED ORDER — KETOROLAC TROMETHAMINE 30 MG/ML IJ SOLN
INTRAMUSCULAR | Status: AC
  Filled 2016-06-20: qty 1

## 2016-06-20 MED ORDER — LIDOCAINE HCL (CARDIAC) 20 MG/ML IV SOLN
INTRAVENOUS | Status: DC | PRN
  Administered 2016-06-20: 100 mg via INTRAVENOUS

## 2016-06-20 MED ORDER — PROMETHAZINE HCL 25 MG/ML IJ SOLN: 6.2500 mg | INTRAMUSCULAR | Status: DC | PRN

## 2016-06-20 MED ORDER — KETOROLAC TROMETHAMINE 30 MG/ML IJ SOLN
INTRAMUSCULAR | Status: DC | PRN
  Administered 2016-06-20: 30 mg via INTRAVENOUS

## 2016-06-20 SURGICAL SUPPLY — 24 items
CANISTERS HI-FLOW 3000CC (CANNISTER) ×2 IMPLANT
CATH FOLEY 2WAY SLVR 30CC 16FR (CATHETERS) IMPLANT
CATH ROBINSON RED A/P 16FR (CATHETERS) ×2 IMPLANT
CLOTH BEACON ORANGE TIMEOUT ST (SAFETY) ×2 IMPLANT
CONTAINER PREFILL 10% NBF 60ML (FORM) ×4 IMPLANT
DEVICE MYOSURE LITE (MISCELLANEOUS) ×2 IMPLANT
DEVICE MYOSURE REACH (MISCELLANEOUS) IMPLANT
FILTER ARTHROSCOPY CONVERTOR (FILTER) ×2 IMPLANT
GLOVE BIOGEL PI IND STRL 7.0 (GLOVE) ×1 IMPLANT
GLOVE BIOGEL PI IND STRL 8 (GLOVE) ×1 IMPLANT
GLOVE BIOGEL PI INDICATOR 7.0 (GLOVE) ×1
GLOVE BIOGEL PI INDICATOR 8 (GLOVE) ×1
GLOVE ECLIPSE 7.5 STRL STRAW (GLOVE) ×4 IMPLANT
GOWN STRL REUS W/TWL LRG LVL3 (GOWN DISPOSABLE) ×4 IMPLANT
PACK VAGINAL MINOR WOMEN LF (CUSTOM PROCEDURE TRAY) ×2 IMPLANT
PAD OB MATERNITY 4.3X12.25 (PERSONAL CARE ITEMS) ×2 IMPLANT
PAD PREP 24X48 CUFFED NSTRL (MISCELLANEOUS) ×2 IMPLANT
PLUG CATH AND CAP STER (CATHETERS) IMPLANT
SEAL ROD LENS SCOPE MYOSURE (ABLATOR) ×2 IMPLANT
SYR 30ML LL (SYRINGE) IMPLANT
TOWEL OR 17X24 6PK STRL BLUE (TOWEL DISPOSABLE) ×4 IMPLANT
TUBING AQUILEX INFLOW (TUBING) ×2 IMPLANT
TUBING AQUILEX OUTFLOW (TUBING) ×2 IMPLANT
WATER STERILE IRR 1000ML POUR (IV SOLUTION) ×2 IMPLANT

## 2016-06-20 NOTE — Anesthesia Preprocedure Evaluation (Signed)
Anesthesia Evaluation  Patient identified by MRN, date of birth, ID band Patient awake    Reviewed: Allergy & Precautions, NPO status , Patient's Chart, lab work & pertinent test results  Airway Mallampati: II  TM Distance: >3 FB Neck ROM: Full    Dental  (+) Dental Advisory Given   Pulmonary neg pulmonary ROS,    breath sounds clear to auscultation       Cardiovascular negative cardio ROS   Rhythm:Regular Rate:Normal     Neuro/Psych negative neurological ROS     GI/Hepatic negative GI ROS, Neg liver ROS,   Endo/Other  negative endocrine ROS  Renal/GU negative Renal ROS     Musculoskeletal   Abdominal   Peds  Hematology negative hematology ROS (+)   Anesthesia Other Findings   Reproductive/Obstetrics                            Anesthesia Physical Anesthesia Plan  ASA: I  Anesthesia Plan: General   Post-op Pain Management:    Induction: Intravenous  Airway Management Planned: LMA  Additional Equipment:   Intra-op Plan:   Post-operative Plan: Extubation in OR  Informed Consent: I have reviewed the patients History and Physical, chart, labs and discussed the procedure including the risks, benefits and alternatives for the proposed anesthesia with the patient or authorized representative who has indicated his/her understanding and acceptance.   Dental advisory given  Plan Discussed with: CRNA  Anesthesia Plan Comments:         Anesthesia Quick Evaluation  

## 2016-06-20 NOTE — Anesthesia Procedure Notes (Signed)
Procedure Name: LMA Insertion Date/Time: 06/20/2016 8:37 AM Performed by: Riki Sheer Pre-anesthesia Checklist: Patient identified, Emergency Drugs available, Suction available, Patient being monitored and Timeout performed Patient Re-evaluated:Patient Re-evaluated prior to inductionOxygen Delivery Method: Circle system utilized Preoxygenation: Pre-oxygenation with 100% oxygen Intubation Type: IV induction Ventilation: Mask ventilation without difficulty LMA: LMA inserted LMA Size: 4.0 Number of attempts: 1 Placement Confirmation: positive ETCO2,  CO2 detector and breath sounds checked- equal and bilateral Tube secured with: Tape Dental Injury: Teeth and Oropharynx as per pre-operative assessment

## 2016-06-20 NOTE — Interval H&P Note (Signed)
History and Physical Interval Note:  06/20/2016 8:05 AM  Dana Manning  has presented today for surgery, with the diagnosis of endometrial polyp, intermenstrual bleeding  The various methods of treatment have been discussed with the patient and family. After consideration of risks, benefits and other options for treatment, the patient has consented to  Procedure(s) with comments: [place] (N/A) - request 7:30am OR time  request one hour OR time as a surgical intervention .  The patient's history has been reviewed, patient examined, no change in status, stable for surgery.  I have reviewed the patient's chart and labs.  Questions were answered to the patient's satisfaction.     Terrance Mass

## 2016-06-20 NOTE — Transfer of Care (Signed)
Immediate Anesthesia Transfer of Care Note  Patient: Dana Manning  Procedure(s) Performed: Procedure(s) with comments: [place] (N/A) - request 7:30am OR time  request one hour OR time  Patient Location: PACU  Anesthesia Type:General  Level of Consciousness: awake, alert  and oriented  Airway & Oxygen Therapy: Patient Spontanous Breathing and Patient connected to nasal cannula oxygen  Post-op Assessment: Report given to RN and Post -op Vital signs reviewed and stable  Post vital signs: Reviewed and stable  Last Vitals:  Vitals:   06/20/16 0759  BP: 107/78  Pulse: 81  Resp: 16  Temp: 37.1 C    Last Pain:  Vitals:   06/20/16 0759  TempSrc: Oral      Patients Stated Pain Goal: 6 ([id number])  Complications: No apparent anesthesia complications

## 2016-06-20 NOTE — Anesthesia Postprocedure Evaluation (Signed)
Anesthesia Post Note  Patient: Dana Manning  Procedure(s) Performed: Procedure(s) (LRB): DILATATION & CURETTAGE/HYSTEROSCOPY WITH MYOSURE (N/A)  Patient location during evaluation: PACU Anesthesia Type: General Level of consciousness: awake Pain management: pain level controlled Vital Signs Assessment: post-procedure vital signs reviewed and stable Respiratory status: spontaneous breathing Cardiovascular status: stable Postop Assessment: no signs of nausea or vomiting Anesthetic complications: no     Last Vitals:  Vitals:   06/20/16 1000 06/20/16 1015  BP: 100/69 98/66  Pulse: 71 75  Resp: 12 12  Temp:      Last Pain:  Vitals:   06/20/16 1015  TempSrc:   PainSc: Asleep   Pain Goal: Patients Stated Pain Goal: 6 (06/20/16 1015)               [place]

## 2016-06-20 NOTE — Op Note (Signed)
   Operative Note  06/20/2016  9:06 AM  PATIENT:  Dana Manning  25 y.o. female  PRE-OPERATIVE DIAGNOSIS:  endometrial polyp, intermenstrual bleeding  POST-OPERATIVE DIAGNOSIS:  endometrial polyp, intermenstrual bleeding  PROCEDURE:  Procedure(s): DILATATION & CURETTAGE/HYSTEROSCOPY WITH MYOSURE  SURGEON:  Surgeon(s): Terrance Mass, MD  ANESTHESIA:   general  FINDINGS: Several endometrial polyps throughout the uterine cavity. Both tubule ostia were identified normal endocervical canal  DESCRIPTION OF OPERATION:FINDINGS: The patient was taken to the operating room where she underwent a successful general endotracheal anesthesia. Patient had PAS stockings for DVT prophylaxis. She received 2 g of Ancef preop. Time out was undertaken to properly identify the patient and the proper operation schedule. The vagina and perineum were prepped and draped in usual sterile fashion. A red rubber Quentin Cornwall was inserted to empty the bladder is content for approximately 50 cc. Bimanual examination demonstrated an anteverted uterus. Patient's legs were in the high lithotomy position. A weighted speculum was placed in the posterior vaginal vault. A single-tooth tenaculum was placed on the anterior cervical lip. The uterus sounded to 7-1/2 cm. Pratt dilator were used to dilate the cervical canal to a  21 mm size. The Hologic Myosure resectoscopic morcellator with a scope of 6.25 mm and an operating blade of 3.0 mm was introduced into the intrauterine cavity. 0.9% normal saline was the distending media. Inspection of the endometrial cavity demonstrated multiple small uterine polyps located throughout the uterine cavity. With the resectoscopic morcellator they were removed and submitted for histological evaluation. Fluid deficit was approximately  300 cc. The single-tooth tenaculum was removed patient was extubated transferred to recovery room stable vital signs blood loss was minimal. She received 30 mg of  Toradol in route to the recovery room.    ESTIMATED BLOOD LOSS: Minimal   Intake/Output Summary (Last 24 hours) at 06/20/16 [id number] Last data filed at 06/20/16 [id number]  Gross per 24 hour  Intake             1000 ml  Output               55 ml  Net              945 ml     BLOOD ADMINISTERED:none   LOCAL MEDICATIONS USED:  NONE  SPECIMEN:  Source of Specimen:  Uterine polyps contents  DISPOSITION OF SPECIMEN:  PATHOLOGY  COUNTS:  YES  PLAN OF CARE: Transfer to PACU  [facility] HMD9:06 AMTD@

## 2016-06-20 NOTE — Discharge Instructions (Signed)
°  Post Anesthesia Home Care Instructions  NO IBUPROFEN PRODUCTS UNTIL: 3PM TODAY  Activity: Get plenty of rest for the remainder of the day. A responsible adult should stay with you for 24 hours following the procedure.  For the next 24 hours, DO NOT: -Drive a car -Paediatric nurse -Drink alcoholic beverages -Take any medication unless instructed by your physician -Make any legal decisions or sign important papers.  Meals: Start with liquid foods such as gelatin or soup. Progress to regular foods as tolerated. Avoid greasy, spicy, heavy foods. If nausea and/or vomiting occur, drink only clear liquids until the nausea and/or vomiting subsides. Call your physician if vomiting continues.  Special Instructions/Symptoms: Your throat may feel dry or sore from the anesthesia or the breathing tube placed in your throat during surgery. If this causes discomfort, gargle with warm salt water. The discomfort should disappear within 24 hours.  If you had a scopolamine patch placed behind your ear for the management of post- operative nausea and/or vomiting:  1. The medication in the patch is effective for 72 hours, after which it should be removed.  Wrap patch in a tissue and discard in the trash. Wash hands thoroughly with soap and water. 2. You may remove the patch earlier than 72 hours if you experience unpleasant side effects which may include dry mouth, dizziness or visual disturbances. 3. Avoid touching the patch. Wash your hands with soap and water after contact with the patch.

## 2016-06-21 ENCOUNTER — Encounter (HOSPITAL_COMMUNITY): Payer: Self-pay | Admitting: Gynecology

## 2016-07-04 ENCOUNTER — Encounter: Payer: Self-pay | Admitting: Gynecology

## 2016-07-04 ENCOUNTER — Ambulatory Visit (INDEPENDENT_AMBULATORY_CARE_PROVIDER_SITE_OTHER): Payer: BLUE CROSS/BLUE SHIELD | Admitting: Gynecology

## 2016-07-04 VITALS — BP 120/76

## 2016-07-04 DIAGNOSIS — Z09 Encounter for follow-up examination after completed treatment for conditions other than malignant neoplasm: Secondary | ICD-10-CM

## 2016-07-04 NOTE — Progress Notes (Signed)
   Patient is a 25 year old that presented to the office today for her two-week postop visit. On 06/20/2016 patient underwent a resectoscopic lipectomy as a result of intermenstrual bleeding. She has done well from her surgery has been using condoms for contraception. Pictures from her surgery as well as findings from surgery and pathology report were shared with her as follows:  FINDINGS: Several endometrial polyps throughout the uterine cavity. Both tubule ostia were identified normal endocervical canal  Diagnosis Endometrial polyp - PROLIFERATIVE ENDOMETRIUM AND BENIGN ENDOMETRIAL POLYP. - NO EVIDENCE OF HYPERPLASIA OR MALIGNANCY.  Exam: Abdomen: Soft nontender no rebound or guarding Pelvic: Bartholin urethra Skene was within normal limits Vagina: No lesions or discharge Cervix: No lesions or discharge Uterus: Anteverted normal size shape and consistency Adnexa: No palpable masses or tenderness Rectal exam not done  Assessment/plan: Patient 2 weeks status post resectoscopic polypectomy as a result of endometrial polyps contributing to her intermenstrual bleeding is doing well. Patient has resumed full normal activity. Patient was offered contraception such as oral contraceptive pill, Mirena IUD, or Nexplanon patient stated she had been on oral contraceptive passing gain weight. Literature information on the Mirena IUD as well as the [place] IUD was provided. Patient is otherwise scheduled to return back in October 2018 for annual exam or when necessary.

## 2016-10-21 ENCOUNTER — Encounter: Payer: Self-pay | Admitting: Gynecology

## 2016-10-21 ENCOUNTER — Ambulatory Visit (INDEPENDENT_AMBULATORY_CARE_PROVIDER_SITE_OTHER): Payer: BLUE CROSS/BLUE SHIELD | Admitting: Gynecology

## 2016-10-21 VITALS — BP 118/76 | Wt 157.2 lb

## 2016-10-21 DIAGNOSIS — N898 Other specified noninflammatory disorders of vagina: Secondary | ICD-10-CM | POA: Diagnosis not present

## 2016-10-21 DIAGNOSIS — N930 Postcoital and contact bleeding: Secondary | ICD-10-CM | POA: Diagnosis not present

## 2016-10-21 LAB — WET PREP FOR TRICH, YEAST, CLUE
Clue Cells Wet Prep HPF POC: NONE SEEN
Trich, Wet Prep: NONE SEEN
Yeast Wet Prep HPF POC: NONE SEEN

## 2016-10-21 MED ORDER — TINIDAZOLE 500 MG PO TABS: ORAL_TABLET | ORAL | 0 refills | Status: DC

## 2016-10-21 NOTE — Progress Notes (Signed)
   Patient is a 26 year old who on December 8 underwent a resectoscopic polypectomy as a result of endometrial polyps contributing to intermenstrual bleeding. She had done well from her surgery was seen for final postop visit on December 22. She has not been sexually active until approximately a week ago which she had intercourse but with a condom and a few days later had noticed some pinkish vaginal discharge. She otherwise is reported normal menstrual cycles lasting approximately 3 weeks ago. Patient with a steady partner. She denied any GU or GI complaints.  Exam: Abdomen: Soft nontender no rebound or guarding Pelvic: Bartholin urethra Skene was within normal limits Vagina: Clear discharge was noted no odor no other gross lesions noted Cervix: No lesions or discharge Bimanual exam uterus anteverted normal size shape and consistency nontender Adnexa: No palpable mass or tenderness Rectal exam not done  Wet prep too numerous to count white blood cells moderate bacteria  Assessment/plan based on the findings is likely that she may have bacterial vaginosis and for this reason she'll be treated Tindamax 500 mg 4 tablets today and repeat in 24 hours. Done Check and half for the slight pinkish discharge she had a few days after intercourse [place] irritation from the latex from the condom or not adequately lubricated. We'll continue to monitor. Patient otherwise scheduled to return back in the end of the year for annual exam or when necessary.

## 2016-10-21 NOTE — Patient Instructions (Signed)
Bacterial Vaginosis Bacterial vaginosis is a vaginal infection that occurs when the normal balance of bacteria in the vagina is disrupted. It results from an overgrowth of certain bacteria. This is the most common vaginal infection among women ages 15-44. Because bacterial vaginosis increases your risk for STIs (sexually transmitted infections), getting treated can help reduce your risk for chlamydia, gonorrhea, herpes, and HIV (human immunodeficiency virus). Treatment is also important for preventing complications in pregnant women, because this condition can cause an early (premature) delivery. What are the causes? This condition is caused by an increase in harmful bacteria that are normally present in small amounts in the vagina. However, the reason that the condition develops is not fully understood. What increases the risk? The following factors may make you more likely to develop this condition:  Having a new sexual partner or multiple sexual partners.  Having unprotected sex.  Douching.  Having an intrauterine device (IUD).  Smoking.  Drug and alcohol abuse.  Taking certain antibiotic medicines.  Being pregnant.  You cannot get bacterial vaginosis from toilet seats, bedding, swimming pools, or contact with objects around you. What are the signs or symptoms? Symptoms of this condition include:  Grey or white vaginal discharge. The discharge can also be watery or foamy.  A fish-like odor with discharge, especially after sexual intercourse or during menstruation.  Itching in and around the vagina.  Burning or pain with urination.  Some women with bacterial vaginosis have no signs or symptoms. How is this diagnosed? This condition is diagnosed based on:  Your medical history.  A physical exam of the vagina.  Testing a sample of vaginal fluid under a microscope to look for a large amount of bad bacteria or abnormal cells. Your health care provider may use a cotton swab  or a small wooden spatula to collect the sample.  How is this treated? This condition is treated with antibiotics. These may be given as a pill, a vaginal cream, or a medicine that is put into the vagina (suppository). If the condition comes back after treatment, a second round of antibiotics may be needed. Follow these instructions at home: Medicines  Take over-the-counter and prescription medicines only as told by your health care provider.  Take or use your antibiotic as told by your health care provider. Do not stop taking or using the antibiotic even if you start to feel better. General instructions  If you have a female sexual partner, tell her that you have a vaginal infection. She should see her health care provider and be treated if she has symptoms. If you have a female sexual partner, he does not need treatment.  During treatment: ? Avoid sexual activity until you finish treatment. ? Do not douche. ? Avoid alcohol as directed by your health care provider. ? Avoid breastfeeding as directed by your health care provider.  Drink enough water and fluids to keep your urine clear or pale yellow.  Keep the area around your vagina and rectum clean. ? Wash the area daily with warm water. ? Wipe yourself from front to back after using the toilet.  Keep all follow-up visits as told by your health care provider. This is important. How is this prevented?  Do not douche.  Wash the outside of your vagina with warm water only.  Use protection when having sex. This includes latex condoms and dental dams.  Limit how many sexual partners you have. To help prevent bacterial vaginosis, it is best to have sex with just   one partner (monogamous).  Make sure you and your sexual partner are tested for STIs.  Wear cotton or cotton-lined underwear.  Avoid wearing tight pants and pantyhose, especially during summer.  Limit the amount of alcohol that you drink.  Do not use any products that  contain nicotine or tobacco, such as cigarettes and e-cigarettes. If you need help quitting, ask your health care provider.  Do not use illegal drugs. Where to find more information:  Centers for Disease Control and Prevention: www.cdc.gov/std  American Sexual Health Association (ASHA): www.ashastd.org  U.S. Department of Health and Human Services, Office on Women's Health: www.womenshealth.gov/ or https://www.womenshealth.gov/a-z-topics/bacterial-vaginosis Contact a health care provider if:  Your symptoms do not improve, even after treatment.  You have more discharge or pain when urinating.  You have a fever.  You have pain in your abdomen.  You have pain during sex.  You have vaginal bleeding between periods. Summary  Bacterial vaginosis is a vaginal infection that occurs when the normal balance of bacteria in the vagina is disrupted.  Because bacterial vaginosis increases your risk for STIs (sexually transmitted infections), getting treated can help reduce your risk for chlamydia, gonorrhea, herpes, and HIV (human immunodeficiency virus). Treatment is also important for preventing complications in pregnant women, because the condition can cause an early (premature) delivery.  This condition is treated with antibiotic medicines. These may be given as a pill, a vaginal cream, or a medicine that is put into the vagina (suppository). This information is not intended to replace advice given to you by your health care provider. Make sure you discuss any questions you have with your health care provider. Document Released: 06/30/2005 Document Revised: 03/15/2016 Document Reviewed: 03/15/2016 Elsevier Interactive Patient Education  2017 Elsevier Inc.  

## 2016-11-26 ENCOUNTER — Encounter: Payer: Self-pay | Admitting: Gynecology

## 2017-07-02 ENCOUNTER — Encounter: Payer: Self-pay | Admitting: Obstetrics & Gynecology

## 2017-07-02 ENCOUNTER — Ambulatory Visit (INDEPENDENT_AMBULATORY_CARE_PROVIDER_SITE_OTHER): Payer: BLUE CROSS/BLUE SHIELD | Admitting: Obstetrics & Gynecology

## 2017-07-02 VITALS — BP 112/74 | Ht 61.5 in | Wt 157.0 lb

## 2017-07-02 DIAGNOSIS — N921 Excessive and frequent menstruation with irregular cycle: Secondary | ICD-10-CM | POA: Diagnosis not present

## 2017-07-02 DIAGNOSIS — Z01419 Encounter for gynecological examination (general) (routine) without abnormal findings: Secondary | ICD-10-CM | POA: Diagnosis not present

## 2017-07-02 DIAGNOSIS — Z23 Encounter for immunization: Secondary | ICD-10-CM | POA: Diagnosis not present

## 2017-07-02 DIAGNOSIS — Z3009 Encounter for other general counseling and advice on contraception: Secondary | ICD-10-CM | POA: Diagnosis not present

## 2017-07-02 DIAGNOSIS — Z113 Encounter for screening for infections with a predominantly sexual mode of transmission: Secondary | ICD-10-CM

## 2017-07-02 DIAGNOSIS — N898 Other specified noninflammatory disorders of vagina: Secondary | ICD-10-CM | POA: Diagnosis not present

## 2017-07-02 LAB — WET PREP FOR TRICH, YEAST, CLUE

## 2017-07-02 NOTE — Progress Notes (Addendum)
Dana Manning [birth year] [id number]   History:    26 y.o. G0  Single.  New boyfriend x 2 months  RP:  Established patient presenting for annual gyn exam   HPI: Menses regular every months with normal flow, but has experienced breakthrough bleeding recently.  Last menstrual period normal on June 13, 2017.  Using condoms for contraception.  No pelvic pain.  Normal vaginal secretions.  Would like to do STD screening given the new boyfriend.  Breasts normal.  Urine and bowel movements normal.  Past medical history,surgical history, family history and social history were all reviewed and documented in the EPIC chart.  Gynecologic History [name] was 06/13/2017. Contraception: condoms Last Pap: 04/2016. Results were: Negative Last mammogram: Never.   Obstetric History OB History  Gravida Para Term Preterm AB Living  0 0 0 0 0 0  SAB TAB Ectopic Multiple Live Births  0 0 0 0 0         ROS: A ROS was performed and pertinent positives and negatives are included in the history.  GENERAL: No fevers or chills. HEENT: No change in vision, no earache, sore throat or sinus congestion. NECK: No pain or stiffness. CARDIOVASCULAR: No chest pain or pressure. No palpitations. PULMONARY: No shortness of breath, cough or wheeze. GASTROINTESTINAL: No abdominal pain, nausea, vomiting or diarrhea, melena or bright red blood per rectum. GENITOURINARY: No urinary frequency, urgency, hesitancy or dysuria. MUSCULOSKELETAL: No joint or muscle pain, no back pain, no recent trauma. DERMATOLOGIC: No rash, no itching, no lesions. ENDOCRINE: No polyuria, polydipsia, no heat or cold intolerance. No recent change in weight. HEMATOLOGICAL: No anemia or easy bruising or bleeding. NEUROLOGIC: No headache, seizures, numbness, tingling or weakness. PSYCHIATRIC: No depression, no loss of interest in normal activity or change in sleep pattern.     Exam:   BP 112/74   Ht 5' 1.5" (1.562 m)    Wt 157 lb (71.2 kg)   LMP 06/13/2017 Comment: No birth control   BMI 29.18 kg/m   Body mass index is 29.18 kg/m.  General appearance : Well developed well nourished female. No acute distress HEENT: Eyes: no retinal hemorrhage or exudates,  Neck supple, trachea midline, no carotid bruits, no thyroidmegaly Lungs: Clear to auscultation, no rhonchi or wheezes, or rib retractions  Heart: Regular rate and rhythm, no murmurs or gallops Breast:Examined in sitting and supine position were symmetrical in appearance, no palpable masses or tenderness,  no skin retraction, no nipple inversion, no nipple discharge, no skin discoloration, no axillary or supraclavicular lymphadenopathy Abdomen: no palpable masses or tenderness, no rebound or guarding Extremities: no edema or skin discoloration or tenderness  Pelvic: Vulva normal  Bartholin, Urethra, Skene Glands: Within normal limits             Vagina: No gross lesions or discharge.  Wet prep done.  Cervix: No gross lesions or discharge.  Pap reflex/Gono-Chlam done  Uterus  AV, normal size, shape and consistency, non-tender and mobile  Adnexa  Without masses or tenderness  Anus and perineum  normal    Assessment/Plan:  26 y.o. female for annual exam   1. Encounter for routine gynecological examination with Papanicolaou smear of cervix Normal gynecologic exam.  Pap reflex done.  Breast exam normal.  2. Encounter for other general counseling or advice on contraception Different contraceptive methods discussed including birth control pills, Nuvaring, patch, progestin IUD and Copper IUD, Nexplanon and Depo-Provera injections.  Will further discuss at follow-up for  pelvic ultrasound.  3. Screen for STD (sexually transmitted disease) Strict condom usage recommended. - HIV antibody (with reflex) - RPR - Hepatitis B Surface AntiGEN - Hepatitis C Antibody  4. Vaginal discharge Wet prep negative.  Recommend using probiotic tablets vaginally as  needed. - WET PREP FOR [place], YEAST, CLUE  5. Metrorrhagia Mild breakthrough bleeding.  Will follow up for pelvic ultrasound to rule out intra-uterine polyp or fibroid. - US Transvaginal Non-OB; Future  Counseling on above issues more than 50% for 10 minutes.   [name] MD, 8:13 AM 07/02/2017

## 2017-07-03 LAB — HEPATITIS C ANTIBODY
HEP C AB: NONREACTIVE
SIGNAL TO CUT-OFF: 0.02 (ref <1.00)

## 2017-07-03 LAB — HEPATITIS B SURFACE ANTIGEN: HEP B S AG: NONREACTIVE

## 2017-07-03 LAB — RPR: RPR Ser Ql: NONREACTIVE

## 2017-07-03 LAB — HIV ANTIBODY (ROUTINE TESTING W REFLEX): HIV: NONREACTIVE

## 2017-07-05 ENCOUNTER — Encounter: Payer: Self-pay | Admitting: Obstetrics & Gynecology

## 2017-07-05 NOTE — Patient Instructions (Addendum)
1. Encounter for routine gynecological examination with Papanicolaou smear of cervix Normal gynecologic exam.  Pap reflex done.  Breast exam normal.  2. Encounter for other general counseling or advice on contraception Different contraceptive methods discussed including birth control pills, nova ring, patch, progestin IUD and Her IUD, Nexplanon and Depo-Provera injections.  Will further discuss at follow-up for pelvic ultrasound.  3. Screen for STD (sexually transmitted disease) Strict condom usage recommended. - HIV antibody (with reflex) - RPR - Hepatitis B Surface AntiGEN - Hepatitis C Antibody  4. Vaginal discharge Wet prep negative.  Recommend using probiotic tablets vaginally as needed. - WET PREP FOR [place], YEAST, CLUE  5. Metrorrhagia Mild breakthrough bleeding.  Will follow up for pelvic ultrasound to rule out intra-uterine polyp or fibroid. - US Transvaginal Non-OB; Future  Dana Manning, it was a pleasure meeting you today!  I will inform you of all your results as soon as available.

## 2017-07-09 LAB — PAP IG, CT-NG, RFX HPV ASCU
C. TRACHOMATIS RNA, TMA: NOT DETECTED
N. GONORRHOEAE RNA, TMA: NOT DETECTED

## 2017-07-29 ENCOUNTER — Other Ambulatory Visit: Payer: Self-pay | Admitting: Obstetrics & Gynecology

## 2017-07-29 ENCOUNTER — Ambulatory Visit (INDEPENDENT_AMBULATORY_CARE_PROVIDER_SITE_OTHER): Payer: BLUE CROSS/BLUE SHIELD

## 2017-07-29 ENCOUNTER — Ambulatory Visit: Payer: BLUE CROSS/BLUE SHIELD | Admitting: Obstetrics & Gynecology

## 2017-07-29 ENCOUNTER — Encounter: Payer: Self-pay | Admitting: Obstetrics & Gynecology

## 2017-07-29 VITALS — BP 124/80

## 2017-07-29 DIAGNOSIS — N921 Excessive and frequent menstruation with irregular cycle: Secondary | ICD-10-CM

## 2017-07-29 DIAGNOSIS — R9389 Abnormal findings on diagnostic imaging of other specified body structures: Secondary | ICD-10-CM | POA: Diagnosis not present

## 2017-07-29 DIAGNOSIS — Z789 Other specified health status: Secondary | ICD-10-CM | POA: Diagnosis not present

## 2017-07-29 DIAGNOSIS — N840 Polyp of corpus uteri: Secondary | ICD-10-CM

## 2017-07-29 NOTE — Progress Notes (Signed)
    Dana Manning Aug 17, 1990 [id number]        27 y.o.  G0 Boyfriend  RP:  Metrorrhagia for Pelvic US  HPI:  LMP 07/17/2017.  No change x last visit with me 07/02/2017:   Menses regular every months with normal flow, but has experienced breakthrough bleeding recently.  Last menstrual period normal on June 13, 2017.  Using condoms for contraception.  No pelvic pain.  Normal vaginal secretions.  Would like to do STD screening given the new boyfriend.  Gono-Chlam Negative 07/02/2017  Past medical history,surgical history, problem list, medications, allergies, family history and social history were all reviewed and documented in the EPIC chart.  Directed ROS with pertinent positives and negatives documented in the history of present illness/assessment and plan.  Exam:  There were no vitals filed for this visit. General appearance:  Normal  Pelvic US today: T/V images.  Uterus anteverted and homogeneous.  Uterus measures 9.08 x 5.57 x 4.62 cm.  Endometrial lining is thickened at 8.7 mm.  Echogenic focus present measuring 12 x 7 mm at the lower uterine segment.  Right and left ovaries normal.  No apparent mass in the right or left adnexa.  No free fluid in the posterior cul-de-sac.   Assessment/Plan:  27 y.o. G0  1. Metrorrhagia Intrauterine lesion per pelvic ultrasound today compatible with an endometrial polyp.  Patient has had an endometrial polyp in the past which was removed by hysteroscopy December 2017.  2. Endometrial polyp Probable endometrial polyp again causing metrorrhagia.  Decision to proceed with a [facility] Myosure resection, D+C.  Patient has had that procedure in December 2017.  Understands the procedure, the risks and the benefits.  Risks reviewed with the patient thoroughly as described below.  3. Use of condoms for contraception                         Patient was counseled as to the risk of surgery to include the following:  1. Infection (prohylactic antibiotics will  be administered)  2. DVT/Pulmonary Embolism (prophylactic pneumo compression stockings will be used)  3.Trauma to internal organs requiring additional surgical procedure to repair any injury to internal organs requiring perhaps additional hospitalization days.  4.Hemmorhage requiring transfusion and blood products which carry risks such as anaphylactic reaction, hepatitis and AIDS  Patient had received literature information on the procedure scheduled and all her questions were answered and fully accepts all risks.  Counseling on above issues more than 50% for 15 minutes.  [name] MD, 2:16 PM 07/29/2017

## 2017-07-30 ENCOUNTER — Telehealth: Payer: Self-pay

## 2017-07-30 NOTE — Telephone Encounter (Signed)
I called patient to schedule Hyst D&C with Myosure resection of polyp.  We scheduled her for Fri., March 8 at 7:30am at [facility].  I reviewed her ins benefits and her estimate surgery prepymt. I advised her that online [employer] Is showing that she is in a grace period from non payment of premium and her last date insured was 07/13/17.  She was unaware of this and I asked her to follow up with [employer] and make sure she resolves that matter so they will help her with her surgery charges.  I will mail her a financial letter and a pamphlet from the [facility].

## 2017-08-02 ENCOUNTER — Encounter: Payer: Self-pay | Admitting: Obstetrics & Gynecology

## 2017-08-02 NOTE — Patient Instructions (Signed)
1. Metrorrhagia Intrauterine lesion per pelvic ultrasound today compatible with an endometrial polyp.  Patient has had an endometrial polyp in the past which was removed by hysteroscopy December 2017.  2. Endometrial polyp Probable endometrial polyp again causing metrorrhagia.  Decision to proceed with a [facility] Myosure resection, D+C.  Patient has had that procedure in December 2017.  Understands the procedure, the risks and the benefits.  Risks reviewed with the patient thoroughly as described below.  3. Use of condoms for contraception                         Patient was counseled as to the risk of surgery to include the following:  1. Infection (prohylactic antibiotics will be administered)  2. DVT/Pulmonary Embolism (prophylactic pneumo compression stockings will be used)  3.Trauma to internal organs requiring additional surgical procedure to repair any injury to internal organs requiring perhaps additional hospitalization days.  4.Hemmorhage requiring transfusion and blood products which carry risks such as anaphylactic reaction, hepatitis and AIDS  Patient had received literature information on the procedure scheduled and all her questions were answered and fully accepts all risks.  Dana Manning, good seeing you today!  Hysteroscopy Hysteroscopy is a procedure used for looking inside the womb (uterus). It may be done for various reasons, including:  To evaluate abnormal bleeding, fibroid (benign, noncancerous) tumors, polyps, scar tissue (adhesions), and possibly cancer of the uterus.  To look for lumps (tumors) and other uterine growths.  To look for causes of why a woman cannot get pregnant (infertility), causes of recurrent loss of pregnancy (miscarriages), or a lost intrauterine device (IUD).  To perform a sterilization by blocking the fallopian tubes from inside the uterus.  In this procedure, a thin, flexible tube with a tiny light and camera on the end of it (hysteroscope) is  used to look inside the uterus. A hysteroscopy should be done right after a menstrual period to be sure you are not pregnant. LET [facility] CARE PROVIDER KNOW ABOUT:  Any allergies you have.  All medicines you are taking, including vitamins, herbs, eye drops, creams, and over-the-counter medicines.  Previous problems you or members of your family have had with the use of anesthetics.  Any blood disorders you have.  Previous surgeries you have had.  Medical conditions you have. RISKS AND COMPLICATIONS Generally, this is a safe procedure. However, as with any procedure, complications can occur. Possible complications include:  Putting a hole in the uterus.  Excessive bleeding.  Infection.  Damage to the cervix.  Injury to other organs.  Allergic reaction to medicines.  Too much fluid used in the uterus for the procedure.  BEFORE THE PROCEDURE  Ask your health care provider about changing or stopping any regular medicines.  Do not take aspirin or blood thinners for 1 week before the procedure, or as directed by your health care provider. These can cause bleeding.  If you smoke, do not smoke for 2 weeks before the procedure.  In some cases, a medicine is placed in the cervix the day before the procedure. This medicine makes the cervix have a larger opening (dilate). This makes it easier for the instrument to be inserted into the uterus during the procedure.  Do not eat or drink anything for at least 8 hours before the surgery.  Arrange for someone to take you home after the procedure. PROCEDURE  You may be given a medicine to relax you (sedative). You may also be given  one of the following: ? A medicine that numbs the area around the cervix (local anesthetic). ? A medicine that makes you sleep through the procedure (general anesthetic).  The hysteroscope is inserted through the vagina into the uterus. The camera on the hysteroscope sends a picture to a TV screen. This  gives the surgeon a good view inside the uterus.  During the procedure, air or a liquid is put into the uterus, which allows the surgeon to see better.  Sometimes, tissue is gently scraped from inside the uterus. These tissue samples are sent to a lab for testing. What to expect after the procedure  If you had a general anesthetic, you may be groggy for a couple hours after the procedure.  If you had a local anesthetic, you will be able to go home as soon as you are stable and feel ready.  You may have some cramping. This normally lasts for a couple days.  You may have bleeding, which varies from light spotting for a few days to menstrual-like bleeding for 3-7 days. This is normal.  If your test results are not back during the visit, make an appointment with your health care provider to find out the results. This information is not intended to replace advice given to you by your health care provider. Make sure you discuss any questions you have with your health care provider. Document Released: 10/06/2000 Document Revised: 12/06/2015 Document Reviewed: 01/27/2013 Elsevier Interactive Patient Education  2017 [employer].

## 2017-08-11 ENCOUNTER — Encounter: Payer: Self-pay | Admitting: Anesthesiology

## 2017-08-14 ENCOUNTER — Telehealth: Payer: Self-pay

## 2017-08-14 NOTE — Telephone Encounter (Signed)
I called patient and per DPR access note on file I left detailed message explaining that her surgery has been moved from the [facility] to the [place] date and time. Left her my direct number in case she has any questions. I told her when they call her for pre op they will give her instructions where to come.

## 2017-08-24 ENCOUNTER — Telehealth: Payer: Self-pay

## 2017-08-24 NOTE — Telephone Encounter (Signed)
Spoke with patient and informed her. °

## 2017-08-24 NOTE — Telephone Encounter (Signed)
Scheduled for D&C, Hysteroscopy w Myosure resection of polyp.   Patient wants to know if any problem if she is on her period day of surgery?

## 2017-08-24 NOTE — Telephone Encounter (Signed)
No problem if light to moderate flow.  If heavy flow, reschedule.

## 2017-09-07 ENCOUNTER — Encounter (HOSPITAL_BASED_OUTPATIENT_CLINIC_OR_DEPARTMENT_OTHER): Payer: Self-pay | Admitting: *Deleted

## 2017-09-07 ENCOUNTER — Other Ambulatory Visit: Payer: Self-pay

## 2017-09-07 ENCOUNTER — Telehealth: Payer: Self-pay

## 2017-09-07 NOTE — Progress Notes (Signed)
SPOKE W/ PT VIA PHONE FOR PRE-OP INTERVIEW.  ARRIVE AT 0530.  NEEDS URINE PREG.   GETTING CBC DONE Wednesday 09-09-2017 AT 1000.

## 2017-09-07 NOTE — Telephone Encounter (Signed)
Patient called because once again the [facility] called her and stated her surgery was scheduled 09/11/17 7:30am. We had originally scheduled her 3/8 and a few weeks ago this had happened and they moved her to 3/1. I assumed it was an error when moving her from [facility] to [facility] main OR.  Now it appears she is back on 3/1 as they have moved her back to [facility] once again. I called and spoke with Delilah Shan and put it back on 09/18/17. I called patient and apologized for all the confusion and confirmed 09/19/17 at 7:30am at [facility].  She still has pamphlet I mailed her previously.

## 2017-09-09 ENCOUNTER — Encounter (HOSPITAL_COMMUNITY)
Admission: RE | Admit: 2017-09-09 | Discharge: 2017-09-09 | Disposition: A | Discharge status: Home / Self Care | Payer: BLUE CROSS/BLUE SHIELD | Source: Ambulatory Visit | Attending: Obstetrics & Gynecology | Admitting: Obstetrics & Gynecology

## 2017-09-09 DIAGNOSIS — Z8042 Family history of malignant neoplasm of prostate: Secondary | ICD-10-CM | POA: Diagnosis not present

## 2017-09-09 DIAGNOSIS — Z800 Family history of malignant neoplasm of digestive organs: Secondary | ICD-10-CM | POA: Diagnosis not present

## 2017-09-09 DIAGNOSIS — N938 Other specified abnormal uterine and vaginal bleeding: Secondary | ICD-10-CM | POA: Diagnosis present

## 2017-09-09 DIAGNOSIS — Z01818 Encounter for other preprocedural examination: Secondary | ICD-10-CM | POA: Insufficient documentation

## 2017-09-09 DIAGNOSIS — N840 Polyp of corpus uteri: Secondary | ICD-10-CM | POA: Diagnosis not present

## 2017-09-09 LAB — CBC
HCT: 36.7 % (ref 36.0–46.0)
HEMOGLOBIN: 11.8 g/dL — AB (ref 12.0–15.0)
MCH: 26.5 pg (ref 26.0–34.0)
MCHC: 32.2 g/dL (ref 30.0–36.0)
MCV: 82.3 fL (ref 78.0–100.0)
Platelets: 387 10*3/uL (ref 150–400)
RBC: 4.46 MIL/uL (ref 3.87–5.11)
RDW: 14.2 % (ref 11.5–15.5)
WBC: 6.4 10*3/uL (ref 4.0–10.5)

## 2017-09-10 ENCOUNTER — Inpatient Hospital Stay (HOSPITAL_COMMUNITY): Admission: RE | Admit: 2017-09-10 | Payer: BLUE CROSS/BLUE SHIELD | Source: Ambulatory Visit

## 2017-09-17 NOTE — Anesthesia Preprocedure Evaluation (Addendum)
Anesthesia Evaluation  Patient identified by MRN, date of birth, ID band Patient awake    Reviewed: Allergy & Precautions, NPO status , Patient's Chart, lab work & pertinent test results  Airway Mallampati: I  TM Distance: >3 FB Neck ROM: Full    Dental no notable dental hx.    Pulmonary neg pulmonary ROS,    Pulmonary exam normal breath sounds clear to auscultation       Cardiovascular negative cardio ROS Normal cardiovascular exam Rhythm:Regular Rate:Normal     Neuro/Psych negative neurological ROS     GI/Hepatic negative GI ROS, Neg liver ROS,   Endo/Other    Renal/GU negative Renal ROS  negative genitourinary   Musculoskeletal   Abdominal   Peds  Hematology  (+) anemia ,   Anesthesia Other Findings   Reproductive/Obstetrics                            Anesthesia Physical Anesthesia Plan  ASA: I  Anesthesia Plan: General   Post-op Pain Management:    Induction: Intravenous  PONV Risk Score and Plan: Treatment may vary due to age or medical condition, Ondansetron, Dexamethasone and Scopolamine patch - Pre-op  Airway Management Planned: LMA  Additional Equipment:   Intra-op Plan:   Post-operative Plan:   Informed Consent: I have reviewed the patients History and Physical, chart, labs and discussed the procedure including the risks, benefits and alternatives for the proposed anesthesia with the patient or authorized representative who has indicated his/her understanding and acceptance.   Dental advisory given  Plan Discussed with: CRNA  Anesthesia Plan Comments:         Anesthesia Quick Evaluation

## 2017-09-18 ENCOUNTER — Encounter (HOSPITAL_BASED_OUTPATIENT_CLINIC_OR_DEPARTMENT_OTHER)
Admission: RE | Disposition: A | Discharge status: Home / Self Care | Payer: Self-pay | Source: Ambulatory Visit | Attending: Obstetrics & Gynecology

## 2017-09-18 ENCOUNTER — Ambulatory Visit (HOSPITAL_BASED_OUTPATIENT_CLINIC_OR_DEPARTMENT_OTHER): Payer: BLUE CROSS/BLUE SHIELD | Admitting: Anesthesiology

## 2017-09-18 ENCOUNTER — Encounter (HOSPITAL_BASED_OUTPATIENT_CLINIC_OR_DEPARTMENT_OTHER): Payer: Self-pay

## 2017-09-18 ENCOUNTER — Ambulatory Visit (HOSPITAL_COMMUNITY)
Admission: RE | Admit: 2017-09-18 | Discharge: 2017-09-18 | Disposition: A | Discharge status: Home / Self Care | Payer: BLUE CROSS/BLUE SHIELD | Source: Ambulatory Visit | Attending: Obstetrics & Gynecology | Admitting: Obstetrics & Gynecology

## 2017-09-18 DIAGNOSIS — Z800 Family history of malignant neoplasm of digestive organs: Secondary | ICD-10-CM | POA: Insufficient documentation

## 2017-09-18 DIAGNOSIS — N840 Polyp of corpus uteri: Secondary | ICD-10-CM | POA: Insufficient documentation

## 2017-09-18 DIAGNOSIS — Z8042 Family history of malignant neoplasm of prostate: Secondary | ICD-10-CM | POA: Insufficient documentation

## 2017-09-18 DIAGNOSIS — N938 Other specified abnormal uterine and vaginal bleeding: Secondary | ICD-10-CM | POA: Insufficient documentation

## 2017-09-18 DIAGNOSIS — N921 Excessive and frequent menstruation with irregular cycle: Secondary | ICD-10-CM | POA: Diagnosis not present

## 2017-09-18 HISTORY — PX: DILATATION & CURETTAGE/HYSTEROSCOPY WITH MYOSURE: SHX6511

## 2017-09-18 HISTORY — DX: Excessive and frequent menstruation with irregular cycle: N92.1

## 2017-09-18 HISTORY — DX: Polyp of corpus uteri: N84.0

## 2017-09-18 LAB — POCT PREGNANCY, URINE: Preg Test, Ur: NEGATIVE

## 2017-09-18 SURGERY — DILATATION & CURETTAGE/HYSTEROSCOPY WITH MYOSURE
Anesthesia: General | Site: Vagina

## 2017-09-18 MED ORDER — DEXAMETHASONE SODIUM PHOSPHATE 10 MG/ML IJ SOLN
INTRAMUSCULAR | Status: DC | PRN
  Administered 2017-09-18: 10 mg via INTRAVENOUS

## 2017-09-18 MED ORDER — KETOROLAC TROMETHAMINE 30 MG/ML IJ SOLN
INTRAMUSCULAR | Status: AC
  Filled 2017-09-18: qty 1

## 2017-09-18 MED ORDER — CEFAZOLIN SODIUM-DEXTROSE 2-4 GM/100ML-% IV SOLN
INTRAVENOUS | Status: AC
  Filled 2017-09-18: qty 100

## 2017-09-18 MED ORDER — KETOROLAC TROMETHAMINE 30 MG/ML IJ SOLN
INTRAMUSCULAR | Status: DC | PRN
  Administered 2017-09-18: 30 mg via INTRAVENOUS

## 2017-09-18 MED ORDER — FENTANYL CITRATE (PF) 100 MCG/2ML IJ SOLN
INTRAMUSCULAR | Status: AC
  Filled 2017-09-18: qty 2

## 2017-09-18 MED ORDER — LACTATED RINGERS IV SOLN
INTRAVENOUS | Status: DC
  Administered 2017-09-18 (×2): via INTRAVENOUS
  Filled 2017-09-18: qty 1000

## 2017-09-18 MED ORDER — GABAPENTIN 300 MG PO CAPS
300.0000 mg | ORAL_CAPSULE | Freq: Once | ORAL | Status: AC
  Administered 2017-09-18: 300 mg via ORAL
  Filled 2017-09-18: qty 1

## 2017-09-18 MED ORDER — LIDOCAINE HCL 1 % IJ SOLN
INTRAMUSCULAR | Status: DC | PRN
  Administered 2017-09-18: 20 mL

## 2017-09-18 MED ORDER — PROPOFOL 10 MG/ML IV BOLUS
INTRAVENOUS | Status: AC
  Filled 2017-09-18: qty 20

## 2017-09-18 MED ORDER — ACETAMINOPHEN 500 MG PO TABS
1000.0000 mg | ORAL_TABLET | Freq: Once | ORAL | Status: AC
Start: 2017-09-18 — End: 2017-09-18
  Administered 2017-09-18: 1000 mg via ORAL
  Filled 2017-09-18: qty 2

## 2017-09-18 MED ORDER — DEXAMETHASONE SODIUM PHOSPHATE 10 MG/ML IJ SOLN
INTRAMUSCULAR | Status: AC
  Filled 2017-09-18: qty 1

## 2017-09-18 MED ORDER — GABAPENTIN 300 MG PO CAPS
ORAL_CAPSULE | ORAL | Status: AC
Start: 2017-09-18 — End: ?
  Filled 2017-09-18: qty 1

## 2017-09-18 MED ORDER — SODIUM CHLORIDE 0.9 % IR SOLN
Status: DC | PRN
  Administered 2017-09-18: 3000 mL

## 2017-09-18 MED ORDER — ONDANSETRON HCL 4 MG/2ML IJ SOLN
INTRAMUSCULAR | Status: DC | PRN
  Administered 2017-09-18: 4 mg via INTRAVENOUS

## 2017-09-18 MED ORDER — SCOPOLAMINE 1 MG/3DAYS TD PT72
MEDICATED_PATCH | TRANSDERMAL | Status: AC
  Filled 2017-09-18: qty 1

## 2017-09-18 MED ORDER — MIDAZOLAM HCL 5 MG/5ML IJ SOLN
INTRAMUSCULAR | Status: DC | PRN
  Administered 2017-09-18: 2 mg via INTRAVENOUS

## 2017-09-18 MED ORDER — SCOPOLAMINE 1 MG/3DAYS TD PT72
MEDICATED_PATCH | TRANSDERMAL | Status: DC | PRN
  Administered 2017-09-18: 1 via TRANSDERMAL

## 2017-09-18 MED ORDER — ACETAMINOPHEN 500 MG PO TABS
ORAL_TABLET | ORAL | Status: AC
  Filled 2017-09-18: qty 2

## 2017-09-18 MED ORDER — FENTANYL CITRATE (PF) 100 MCG/2ML IJ SOLN
INTRAMUSCULAR | Status: DC | PRN
  Administered 2017-09-18 (×2): 50 ug via INTRAVENOUS

## 2017-09-18 MED ORDER — ONDANSETRON HCL 4 MG/2ML IJ SOLN
INTRAMUSCULAR | Status: AC
  Filled 2017-09-18: qty 2

## 2017-09-18 MED ORDER — CLONIDINE HCL 0.2 MG PO TABS
ORAL_TABLET | ORAL | Status: AC
  Filled 2017-09-18: qty 1

## 2017-09-18 MED ORDER — PROPOFOL 10 MG/ML IV BOLUS
INTRAVENOUS | Status: DC | PRN
  Administered 2017-09-18: 200 mg via INTRAVENOUS

## 2017-09-18 MED ORDER — LIDOCAINE 2% (20 MG/ML) 5 ML SYRINGE
INTRAMUSCULAR | Status: DC | PRN
  Administered 2017-09-18: 60 mg via INTRAVENOUS

## 2017-09-18 MED ORDER — CEFAZOLIN SODIUM-DEXTROSE 2-4 GM/100ML-% IV SOLN
2.0000 g | INTRAVENOUS | Status: AC
  Administered 2017-09-18: 2 g via INTRAVENOUS
  Filled 2017-09-18: qty 100

## 2017-09-18 MED ORDER — MIDAZOLAM HCL 2 MG/2ML IJ SOLN
INTRAMUSCULAR | Status: AC
  Filled 2017-09-18: qty 2

## 2017-09-18 MED ORDER — LACTATED RINGERS IV SOLN
INTRAVENOUS | Status: DC
  Filled 2017-09-18: qty 1000

## 2017-09-18 MED ORDER — CLONIDINE HCL 0.2 MG PO TABS
0.2000 mg | ORAL_TABLET | Freq: Once | ORAL | Status: AC
  Administered 2017-09-18: 0.2 mg via ORAL
  Filled 2017-09-18: qty 1

## 2017-09-18 SURGICAL SUPPLY — 23 items
CANISTER SUCT 3000ML PPV (MISCELLANEOUS) ×2 IMPLANT
CATH ROBINSON RED A/P 16FR (CATHETERS) ×2 IMPLANT
COUNTER NEEDLE 1200 MAGNETIC (NEEDLE) ×2 IMPLANT
DEVICE MYOSURE LITE (MISCELLANEOUS) IMPLANT
DEVICE MYOSURE REACH (MISCELLANEOUS) ×2 IMPLANT
DILATOR CANAL MILEX (MISCELLANEOUS) IMPLANT
ELECT REM PT RETURN 9FT ADLT (ELECTROSURGICAL)
ELECTRODE REM PT RTRN 9FT ADLT (ELECTROSURGICAL) IMPLANT
FILTER ARTHROSCOPY CONVERTOR (FILTER) ×2 IMPLANT
GLOVE BIO SURGEON STRL SZ 6.5 (GLOVE) ×2 IMPLANT
GLOVE BIOGEL PI IND STRL 7.0 (GLOVE) ×2 IMPLANT
GLOVE BIOGEL PI INDICATOR 7.0 (GLOVE) ×2
GOWN STRL REUS W/TWL LRG LVL3 (GOWN DISPOSABLE) ×4 IMPLANT
IV NS IRRIG 3000ML ARTHROMATIC (IV SOLUTION) ×4 IMPLANT
MYOSURE XL FIBROID REM (MISCELLANEOUS)
PACK VAGINAL MINOR WOMEN LF (CUSTOM PROCEDURE TRAY) ×2 IMPLANT
PAD OB MATERNITY 4.3X12.25 (PERSONAL CARE ITEMS) ×2 IMPLANT
PAD PREP 24X48 CUFFED NSTRL (MISCELLANEOUS) ×2 IMPLANT
SEAL ROD LENS SCOPE MYOSURE (ABLATOR) ×2 IMPLANT
SYSTEM TISS REMOVAL MYSR XL RM (MISCELLANEOUS) IMPLANT
TOWEL OR 17X24 6PK STRL BLUE (TOWEL DISPOSABLE) ×4 IMPLANT
TUBING AQUILEX INFLOW (TUBING) ×2 IMPLANT
TUBING AQUILEX OUTFLOW (TUBING) ×2 IMPLANT

## 2017-09-18 NOTE — Transfer of Care (Signed)
Immediate Anesthesia Transfer of Care Note  Patient: Dana Manning  Procedure(s) Performed: DILATATION & CURETTAGE/HYSTEROSCOPY WITH MYOSURE (N/A Vagina )  Patient Location: PACU  Anesthesia Type:General  Level of Consciousness: awake  Airway & Oxygen Therapy: Patient Spontanous Breathing and Patient connected to nasal cannula oxygen  Post-op Assessment: Report given to RN and Post -op Vital signs reviewed and stable  Post vital signs: Reviewed  Last Vitals:  Vitals:   09/18/17 0536  BP: 119/74  Pulse: 87  Resp: 18  Temp: 36.9 C  SpO2: 100%    Last Pain:  Vitals:   09/18/17 0536  TempSrc: Oral      Patients Stated Pain Goal: 5 (12/81/18 8677)  Complications: No apparent anesthesia complications

## 2017-09-18 NOTE — Anesthesia Procedure Notes (Signed)
Procedure Name: LMA Insertion Date/Time: 09/18/2017 7:46 AM Performed by: Garner Nash, CRNA Pre-anesthesia Checklist: Patient identified, Suction available, Patient being monitored, Timeout performed and Emergency Drugs available Patient Re-evaluated:Patient Re-evaluated prior to induction Oxygen Delivery Method: Circle system utilized Preoxygenation: Pre-oxygenation with 100% oxygen Induction Type: IV induction LMA: LMA inserted LMA Size: 4.0 Number of attempts: 1 Placement Confirmation: ETT inserted through vocal cords under direct vision,  positive ETCO2 and breath sounds checked- equal and bilateral Tube secured with: Tape Dental Injury: Teeth and Oropharynx as per pre-operative assessment

## 2017-09-18 NOTE — Discharge Instructions (Addendum)
May take Advil, Ibuprofen, or Motrin after 2 PM. May take Tylenol after 1000 AM.  Post Anesthesia Home Care Instructions  Activity: Get plenty of rest for the remainder of the day. A responsible individual must stay with you for 24 hours following the procedure.  For the next 24 hours, DO NOT: -Drive a car -Paediatric nurse -Drink alcoholic beverages -Take any medication unless instructed by your physician -Make any legal decisions or sign important papers.  Meals: Start with liquid foods such as gelatin or soup. Progress to regular foods as tolerated. Avoid greasy, spicy, heavy foods. If nausea and/or vomiting occur, drink only clear liquids until the nausea and/or vomiting subsides. Call your physician if vomiting continues.  Special Instructions/Symptoms: Your throat may feel dry or sore from the anesthesia or the breathing tube placed in your throat during surgery. If this causes discomfort, gargle with warm salt water. The discomfort should disappear within 24 hours.  If you had a scopolamine patch placed behind your ear for the management of post- operative nausea and/or vomiting:  1. The medication in the patch is effective for 72 hours, after which it should be removed.  Wrap patch in a tissue and discard in the trash. Wash hands thoroughly with soap and water. 2. You may remove the patch earlier than 72 hours if you experience unpleasant side effects which may include dry mouth, dizziness or visual disturbances. 3. Avoid touching the patch. Wash your hands with soap and water after contact with the patch.   Hysteroscopy, Care After Refer to this sheet in the next few weeks. These instructions provide you with information on caring for yourself after your procedure. Your health care provider may also give you more specific instructions. Your treatment has been planned according to current medical practices, but problems sometimes occur. Call your health care provider if you have  any problems or questions after your procedure. What can I expect after the procedure? After your procedure, it is typical to have the following:  You may have some cramping. This normally lasts for a couple days.  You may have bleeding. This can vary from light spotting for a few days to menstrual-like bleeding for 3-7 days.  Follow these instructions at home:  Rest for the first 1-2 days after the procedure.  Only take over-the-counter or prescription medicines as directed by your health care provider. Do not take aspirin. It can increase the chances of bleeding.  Take showers instead of baths for 2 weeks or as directed by your health care provider.  Do not drive for 24 hours or as directed.  Do not drink alcohol while taking pain medicine.  Do not use tampons, douche, or have sexual intercourse for 2 weeks or until your health care provider says it is okay.  Take your temperature twice a day for 4-5 days. Write it down each time.  Follow your health care provider's advice about diet, exercise, and lifting.  If you develop constipation, you may: ? Take a mild laxative if your health care provider approves. ? Add bran foods to your diet. ? Drink enough fluids to keep your urine clear or pale yellow.  Try to have someone with you or available to you for the first 24-48 hours, especially if you were given a general anesthetic.  Follow up with your health care provider as directed. Contact a health care provider if:  You feel dizzy or lightheaded.  You feel sick to your stomach (nauseous).  You have abnormal vaginal  discharge.  You have a rash.  You have pain that is not controlled with medicine. Get help right away if:  You have bleeding that is heavier than a normal menstrual period.  You have a fever.  You have increasing cramps or pain, not controlled with medicine.  You have new belly (abdominal) pain.  You pass out.  You have pain in the tops of your  shoulders (shoulder strap areas).  You have shortness of breath. This information is not intended to replace advice given to you by your health care provider. Make sure you discuss any questions you have with your health care provider. Document Released: 04/20/2013 Document Revised: 12/06/2015 Document Reviewed: 01/27/2013 Elsevier Interactive Patient Education  2017 [employer].

## 2017-09-18 NOTE — Anesthesia Postprocedure Evaluation (Signed)
Anesthesia Post Note  Patient: Dana Manning  Procedure(s) Performed: DILATATION & CURETTAGE/HYSTEROSCOPY WITH MYOSURE (N/A Vagina )     Patient location during evaluation: PACU Anesthesia Type: General Level of consciousness: awake and alert Pain management: pain level controlled Vital Signs Assessment: post-procedure vital signs reviewed and stable Respiratory status: spontaneous breathing, nonlabored ventilation, respiratory function stable and patient connected to nasal cannula oxygen Cardiovascular status: blood pressure returned to baseline and stable Postop Assessment: no apparent nausea or vomiting Anesthetic complications: no    Last Vitals:  Vitals:   09/18/17 1045 09/18/17 1200  BP: 102/61 (!) 100/54  Pulse: 72 81  Resp: 16 17  Temp:  37.3 C  SpO2: 99% 100%    Last Pain:  Vitals:   09/18/17 0536  TempSrc: Oral                 Barnet Glasgow

## 2017-09-18 NOTE — H&P (Signed)
Dana Manning is an 27 y.o. female. G0 Boyfriend  RP:  Metrorrhagia/IU Polyp for [facility] Myosure resection/D+C  HPI:  LMP 2/8//2019.  No change x last visit with me 07/29/2017:  Menses regular every months with normal flow,but has experienced breakthrough bleeding recently. Last menstrual period normal on 08/21/2017. Using condoms for contraception. No pelvic pain. Normal vaginal secretions.  Gono-Chlam Negative 06/2017  Pertinent Gynecological History: Menses: flow is moderate with BTB Contraception: condoms Blood transfusions: none Sexually transmitted diseases: no past history Previous GYN Procedures: HSC/Resection/D+C 06/2016  Last mammogram: Never Last pap: normal  OB History: G0  Menstrual History: [name] was 08/21/2017 (exact date).    Past Medical History:  Diagnosis Date  . Anemia    09-07-2017 per pt mild  . Endometrial polyp   . Metrorrhagia     Past Surgical History:  Procedure Laterality Date  . APPENDECTOMY  2006  . DILATATION & CURETTAGE/HYSTEROSCOPY WITH MYOSURE N/A 06/20/2016   Procedure: DILATATION & CURETTAGE/HYSTEROSCOPY WITH MYOSURE;  Surgeon: Terrance Mass, MD;  Location: [facility];  Service: Gynecology;  Laterality: N/A;    . TONSILLECTOMY  age 45    Family History  Problem Relation Age of Onset  . Cancer Maternal Grandmother        SMALL INTESTINE-   . Cancer Maternal Grandfather        PROSTATE- LYMPHOMA    Social History:  reports that  has never smoked. she has never used smokeless tobacco. She reports that she does not drink alcohol or use drugs.  Allergies: No Known Allergies  No medications prior to admission.    REVIEW OF SYSTEMS: A ROS was performed and pertinent positives and negatives are included in the history.  GENERAL: No fevers or chills. HEENT: No change in vision, no earache, sore throat or sinus congestion. NECK: No pain or stiffness. CARDIOVASCULAR: No chest pain or pressure. No  palpitations. PULMONARY: No shortness of breath, cough or wheeze. GASTROINTESTINAL: No abdominal pain, nausea, vomiting or diarrhea, melena or bright red blood per rectum. GENITOURINARY: No urinary frequency, urgency, hesitancy or dysuria. MUSCULOSKELETAL: No joint or muscle pain, no back pain, no recent trauma. DERMATOLOGIC: No rash, no itching, no lesions. ENDOCRINE: No polyuria, polydipsia, no heat or cold intolerance. No recent change in weight. HEMATOLOGICAL: No anemia or easy bruising or bleeding. NEUROLOGIC: No headache, seizures, numbness, tingling or weakness. PSYCHIATRIC: No depression, no loss of interest in normal activity or change in sleep pattern.     Blood pressure 119/74, pulse 87, temperature 98.5 F (36.9 C), temperature source Oral, resp. rate 18, height 5' 1.5" (1.562 m), weight 159 lb 12.8 oz (72.5 kg), last menstrual period 08/21/2017, SpO2 100 %.  Physical Exam:  See office notes   Results for orders placed or performed during the hospital encounter of 09/18/17 (from the past 24 hour(s))  Pregnancy, urine POC     Status: None   Collection Time: 09/18/17  5:49 AM  Result Value Ref Range   Preg Test, Ur NEGATIVE NEGATIVE   Pelvic US 07/29/2017: T/V images.  Uterus anteverted and homogeneous.  Uterus measures 9.08 x 5.57 x 4.62 cm.  Endometrial lining is thickened at 8.7 mm.  Echogenic focus present measuring 12 x 7 mm at the lower uterine segment.  Right and left ovaries normal.  No apparent mass in the right or left adnexa.  No free fluid in the posterior cul-de-sac.   Assessment/Plan:  27 y.o. G0  1. Metrorrhagia Intrauterine lesion per  pelvic ultrasound today compatible with an endometrial polyp.  Patient has had an endometrial polyp in the past which was removed by hysteroscopy December 2017.  2. Endometrial polyp Probable endometrial polyp again causing metrorrhagia.  Decision to proceed with a [facility] Myosure resection, D+C.  Patient has had that procedure in  December 2017.  Understands the procedure, the risks and the benefits.  Risks reviewed with the patient thoroughly as described below.  3. Use of condoms for contraception                         Patient was counseled as to the risk of surgery to include the following:  1. Infection (prohylactic antibiotics will be administered)  2. DVT/Pulmonary Embolism (prophylactic pneumo compression stockings will be used)  3.Trauma to internal organs requiring additional surgical procedure to repair any injury to internal organs requiring perhaps additional hospitalization days.  4.Hemmorhage requiring transfusion and blood products which carry risks such as anaphylactic reaction, hepatitis and AIDS  Patient had received literature information on the procedure scheduled and all her questions were answered and fully accepts all risks.   Dana Manning 09/18/2017, 7:29 AM

## 2017-09-18 NOTE — Op Note (Addendum)
Operative Note  09/18/2017  8:18 AM  PATIENT:  Dana Manning  27 y.o. female  PRE-OPERATIVE DIAGNOSIS:  Endometrial polyp  POST-OPERATIVE DIAGNOSIS:  Many endometrial polyps  PROCEDURE:  Procedure(s): HYSTEROSCOPY WITH MYOSURE RESECTION, DILATATION & CURETTAGE  SURGEON:  Surgeon(s): [name], MD  ANESTHESIA:   general  FINDINGS: Many Endometrial Polyps throughout the intrauterine cavity  DESCRIPTION OF OPERATION: Under general anesthesia with laryngeal mask, the patient is in lithotomy position.  She is prepped with Betadine on the suprapubic, vulvar and vaginal areas.  The bladder is catheterized.  The patient is draped as usual.  Timeout is done.  The vaginal exam reveals an anteverted uterus, normal volume, mobile.  No adnexal mass.  The speculum is inserted in the vagina.  The anterior lip of the cervix is grasped with a tenaculum.  A paracervical block is done with lidocaine 1%, a total of 20 cc at 4 and 8:00.  Dilation of the cervix with Pratt dilators up to #25 without difficulty.  The hysteroscope was inserted in the intrauterine cavity.  Inspection of the cavity reveals 2 normal ostia, many polyps are present throughout the cavity.  No other lesion.  The reach Myosure is inserted.  Excision of all the polyps with the Myosure.  The intrauterine cavity looks completely normal after resection.  Pictures were taken before the resection and after.  Hemostasis is adequate.  The hysteroscope with the Myosure are removed.  We proceed with a systematic curettage of the intrauterine cavity on all surfaces with a sharp curette.  Both specimens are sent to pathology together.  The tenaculum is removed from the cervix.  Hemostasis is adequate at that level as well.  The speculum is removed.  The patient is brought to recovery room in good and stable status.  ESTIMATED BLOOD LOSS: 10 mL Fluid deficit 700 cc  Intake/Output Summary (Last 24 hours) at 09/18/2017 0818 Last data filed at  09/18/2017 0810 Gross per 24 hour  Intake -  Output 110 ml  Net -110 ml     BLOOD ADMINISTERED:none   LOCAL MEDICATIONS USED:  LIDOCAINE   SPECIMEN:  Source of Specimen:  Polyp excision material and endometrial curettings  DISPOSITION OF SPECIMEN:  PATHOLOGY  COUNTS:  YES  PLAN OF CARE: Transfer to PACU  Marie-Lyne LavoieMD8:18 AM

## 2017-09-21 ENCOUNTER — Encounter (HOSPITAL_BASED_OUTPATIENT_CLINIC_OR_DEPARTMENT_OTHER): Payer: Self-pay | Admitting: Obstetrics & Gynecology

## 2017-10-09 ENCOUNTER — Ambulatory Visit: Payer: BLUE CROSS/BLUE SHIELD | Admitting: Gynecology

## 2017-10-09 ENCOUNTER — Ambulatory Visit (INDEPENDENT_AMBULATORY_CARE_PROVIDER_SITE_OTHER): Payer: BLUE CROSS/BLUE SHIELD | Admitting: Obstetrics & Gynecology

## 2017-10-09 ENCOUNTER — Encounter: Payer: Self-pay | Admitting: Obstetrics & Gynecology

## 2017-10-09 VITALS — BP 126/84

## 2017-10-09 DIAGNOSIS — Z09 Encounter for follow-up examination after completed treatment for conditions other than malignant neoplasm: Secondary | ICD-10-CM

## 2017-10-09 DIAGNOSIS — Z30011 Encounter for initial prescription of contraceptive pills: Secondary | ICD-10-CM

## 2017-10-09 DIAGNOSIS — N840 Polyp of corpus uteri: Secondary | ICD-10-CM

## 2017-10-09 MED ORDER — NORETHINDRONE 0.35 MG PO TABS: 1.0000 | ORAL_TABLET | Freq: Every day | ORAL | 4 refills | Status: DC

## 2017-10-09 NOTE — Progress Notes (Signed)
    Maricel Swartzendruber 1991-04-29 [id number]        27 y.o.  G0 Single  RP: Postop HSC/Excision of Polyps/D+C  HPI: Good postop evolution.  No pelvic pain.  No vaginal bleeding.  Normal vaginal secretions.  No fever.  Urine and bowel movements normal.  Broke up with new boyfriend.  Currently abstinent.   OB History  Gravida Para Term Preterm AB Living  0 0 0 0 0 0  SAB TAB Ectopic Multiple Live Births  0 0 0 0 0    Past medical history,surgical history, problem list, medications, allergies, family history and social history were all reviewed and documented in the EPIC chart.   Directed ROS with pertinent positives and negatives documented in the history of present illness/assessment and plan.  Exam:  Vitals:   10/09/17 1042  BP: 126/84   General appearance:  Normal  Abdomen: Normal  Gynecologic exam: Vulva normal.  Bimanual exam: Uterus anteverted, normal size, mobile, nontender.  No adnexal mass, nontender.  No vaginal bleeding and normal secretions.  Pathology 09/18/2017:   Endometrium, curettage, and resection - BENIGN ENDOMETRIAL-TYPE POLYP - SECRETORY ENDOMETRIUM - NO HYPERPLASIA OR MALIGNANCY IDENTIFIED   Assessment/Plan:  27 y.o. G0P0000   1. Follow-up examination after gynecological surgery 3 weeks post hysteroscopy with MyoSure resection of polyps and D&C.  No surgical complication.  Very good healing postop.  Pictures of the surgery shown to patient.  Pathology benign, discussed with patient.  2. Endometrial polyps Recurrent multiple endometrial polyps.  Decision to start on the progestin only pill in the hope to control/prevent recurrence of polyps.  Usage, risks and benefits reviewed with patient prescription sent to pharmacy.  3. Encounter for initial prescription of contraceptive pills Started on progestin only pill.  Informed that it is efficient at 95%.  Strongly recommend usage of condoms both to secure contraception and prevent STD.  Other orders -  norethindrone (MICRONOR,CAMILA,ERRIN) 0.35 MG tablet; Take 1 tablet (0.35 mg total) by mouth daily.  Counseling on above issues and coordination of care more than 50% for 15 minutes.  [name] MD, 10:53 AM 10/09/2017

## 2017-10-09 NOTE — Patient Instructions (Signed)
1. Follow-up examination after gynecological surgery 3 weeks post hysteroscopy with MyoSure resection of polyps and D&C.  No surgical complication.  Very good healing postop.  Pictures of the surgery shown to patient.  Pathology benign, discussed with patient.  2. Endometrial polyps Recurrent multiple endometrial polyps.  Decision to start on the progestin only pill in the hope to control/prevent recurrence of polyps.  Usage, risks and benefits reviewed with patient prescription sent to pharmacy.  3. Encounter for initial prescription of contraceptive pills Started on progestin only pill.  Informed that it is efficient at 95%.  Strongly recommend usage of condoms both to secure contraception and prevent STD.  Other orders - norethindrone (MICRONOR,CAMILA,ERRIN) 0.35 MG tablet; Take 1 tablet (0.35 mg total) by mouth daily.  Marianna Fuss, good seeing you today!  Norethindrone tablets (contraception) What is this medicine? NORETHINDRONE (nor eth IN drone) is an oral contraceptive. The product contains a female hormone known as a progestin. It is used to prevent pregnancy. This medicine may be used for other purposes; ask your health care provider or pharmacist if you have questions. COMMON BRAND NAME(S): Camila, Deblitane 28-Day, Errin, Heather, Sullivan, Jolivette, [place], Nor-QD, Nora-BE, Norlyroc, Ortho Micronor, [employer] 28-Day What should I tell my health care provider before I take this medicine? They need to know if you have any of these conditions: -blood vessel disease or blood clots -breast, cervical, or vaginal cancer -diabetes -heart disease -kidney disease -liver disease -mental depression -migraine -seizures -stroke -vaginal bleeding -an unusual or allergic reaction to norethindrone, other medicines, foods, dyes, or preservatives -pregnant or trying to get pregnant -breast-feeding How should I use this medicine? Take this medicine by mouth with a glass of water. You may take it  with or without food. Follow the directions on the prescription label. Take this medicine at the same time each day and in the order directed on the package. Do not take your medicine more often than directed. Contact your pediatrician regarding the use of this medicine in children. Special care may be needed. This medicine has been used in female children who have started having menstrual periods. A patient package insert for the product will be given with each prescription and refill. Read this sheet carefully each time. The sheet may change frequently. Overdosage: If you think you have taken too much of this medicine contact a poison control center or emergency room at once. NOTE: This medicine is only for you. Do not share this medicine with others. What if I miss a dose? Try not to miss a dose. Every time you miss a dose or take a dose late your chance of pregnancy increases. When 1 pill is missed (even if only 3 hours late), take the missed pill as soon as possible and continue taking a pill each day at the regular time (use a back up method of birth control for the next 48 hours). If more than 1 dose is missed, use an additional birth control method for the rest of your pill pack until menses occurs. Contact your health care professional if more than 1 dose has been missed. What may interact with this medicine? Do not take this medicine with any of the following medications: -amprenavir or fosamprenavir -bosentan This medicine may also interact with the following medications: -antibiotics or medicines for infections, especially rifampin, rifabutin, rifapentine, and griseofulvin, and possibly penicillins or tetracyclines -aprepitant -barbiturate medicines, such as phenobarbital -carbamazepine -felbamate -modafinil -oxcarbazepine -phenytoin -ritonavir or other medicines for HIV infection or AIDS -St. John's  wort -topiramate This list may not describe all possible interactions. Give your  health care provider a list of all the medicines, herbs, non-prescription drugs, or dietary supplements you use. Also tell them if you smoke, drink alcohol, or use illegal drugs. Some items may interact with your medicine. What should I watch for while using this medicine? Visit your doctor or health care professional for regular checks on your progress. You will need a regular breast and pelvic exam and Pap smear while on this medicine. Use an additional method of birth control during the first cycle that you take these tablets. If you have any reason to think you are pregnant, stop taking this medicine right away and contact your doctor or health care professional. If you are taking this medicine for hormone related problems, it may take several cycles of use to see improvement in your condition. This medicine does not protect you against HIV infection (AIDS) or any other sexually transmitted diseases. What side effects may I notice from receiving this medicine? Side effects that you should report to your doctor or health care professional as soon as possible: -breast tenderness or discharge -pain in the abdomen, chest, groin or leg -severe headache -skin rash, itching, or hives -sudden shortness of breath -unusually weak or tired -vision or speech problems -yellowing of skin or eyes Side effects that usually do not require medical attention (report to your doctor or health care professional if they continue or are bothersome): -changes in sexual desire -change in menstrual flow -facial hair growth -fluid retention and swelling -headache -irritability -nausea -weight gain or loss This list may not describe all possible side effects. Call your doctor for medical advice about side effects. You may report side effects to FDA at 1-800-FDA-1088. Where should I keep my medicine? Keep out of the reach of children. Store at room temperature between 15 and 30 degrees C (59 and 86 degrees F).  Throw away any unused medicine after the expiration date. NOTE: This sheet is a summary. It may not cover all possible information. If you have questions about this medicine, talk to your doctor, pharmacist, or health care provider.  2018 Elsevier/Gold Standard (2012-03-19 16:41:35)

## 2017-10-16 ENCOUNTER — Telehealth: Payer: Self-pay | Admitting: *Deleted

## 2017-10-16 ENCOUNTER — Encounter: Payer: Self-pay | Admitting: *Deleted

## 2017-10-16 NOTE — Telephone Encounter (Signed)
Pt called stating she needs a letter stating she received flu vaccination at this office on 07/02/2017, letter printed, pt will come pick up.

## 2018-06-30 ENCOUNTER — Other Ambulatory Visit: Payer: Self-pay | Admitting: Gastroenterology

## 2018-06-30 DIAGNOSIS — R110 Nausea: Secondary | ICD-10-CM

## 2018-07-05 ENCOUNTER — Encounter: Payer: BLUE CROSS/BLUE SHIELD | Admitting: Obstetrics & Gynecology

## 2018-07-08 ENCOUNTER — Ambulatory Visit
Admission: RE | Admit: 2018-07-08 | Discharge: 2018-07-08 | Disposition: A | Discharge status: Home / Self Care | Payer: BLUE CROSS/BLUE SHIELD | Source: Ambulatory Visit | Attending: Gastroenterology | Admitting: Gastroenterology

## 2018-07-08 DIAGNOSIS — R110 Nausea: Secondary | ICD-10-CM

## 2018-09-08 ENCOUNTER — Encounter: Payer: BLUE CROSS/BLUE SHIELD | Admitting: Obstetrics & Gynecology

## 2019-04-04 ENCOUNTER — Encounter: Payer: Self-pay | Admitting: Gynecology

## 2019-07-19 ENCOUNTER — Ambulatory Visit: Payer: Self-pay

## 2019-07-19 ENCOUNTER — Other Ambulatory Visit: Payer: Self-pay

## 2019-07-19 ENCOUNTER — Ambulatory Visit (INDEPENDENT_AMBULATORY_CARE_PROVIDER_SITE_OTHER): Payer: 59 | Admitting: Family Medicine

## 2019-07-19 ENCOUNTER — Encounter: Payer: Self-pay | Admitting: Family Medicine

## 2019-07-19 DIAGNOSIS — M545 Low back pain: Secondary | ICD-10-CM

## 2019-07-19 DIAGNOSIS — G8929 Other chronic pain: Secondary | ICD-10-CM

## 2019-07-19 DIAGNOSIS — M25562 Pain in left knee: Secondary | ICD-10-CM

## 2019-07-19 MED ORDER — MELOXICAM 15 MG PO TABS: 7.5000 mg | ORAL_TABLET | Freq: Every day | ORAL | 6 refills | Status: DC | PRN

## 2019-07-19 NOTE — Progress Notes (Signed)
I saw and examined the patient with Dr. Mayer Masker and agree with assessment and plan as outlined.    Chronic LBP two years s/p fall at work.  Diagnosed with grade 1 spondylolisthesis per Dr. Jola Baptist.  No radicular symptoms.  Axial pain and SI-area pain.  X-Rays today look good overall, possibly very minimal listhesis at L5-S1.  Will try PT.  MRI if no improvement.  Acute left knee pain with tender medial joint line, no injury.  Trial of meloxicam and PT.  X-Rays look normal.

## 2019-07-19 NOTE — Progress Notes (Signed)
Dana Manning - 29 y.o. female MRN [id number]  Date of birth: 17-Apr-1991  Office Visit Note: Visit Date: 07/19/2019 PCP: Kristopher Glee., MD Referred by: Kristopher Glee., MD  Subjective: Chief Complaint  Patient presents with  . Lower Back - Pain    Was diagnosed with spondylolisthesis 2 years ago post fall at work. Been doing stretches - temporary relief. Ibuprofen is not helpful. No radiating pain to the legs. No numbness/tingling. Pain across lower back, but it worse on the left.   . Left Knee - Pain    Pain x 1&1/2 - 2 weeks. NKI. Had an issue with her knee last year (no injury) and it went away. Pain in medial and up to superior to patella. No swelling. A little popping. No locking. Does give way (no falls).   HPI: Dana Manning is a 29 y.o. female who comes in today with chronic lower back pain and acute left knee pain.  She reports that she has had pain in lower back for the past 2 years. She fell off a step stool at work in October 2018, landed on her buttocks. After that time, she had pain in lower back. She went to PT and was shown stretches for her back. She also went to a chiropractor, who obtained lumbar x-rays and told her that she had spondylolisthesis. He did corrections with temporary relief. She reports that she has lower back pain with prolonged sitting. She works out with a Physiological scientist 2-3 times a week and is able to do exercises without pain. No numbness/tingling down either leg.   Left knee has been hurting for the past 1.5 weeks. Pain on medial side of knee with occasional feeling of giving out.  She does not recall an injury. No popping/locking/catching. No swelling. She bought a brace at a drug store which has not helped much with pain. Most painful when bending knee or turning.    ROS Otherwise per HPI.  Assessment & Plan: Visit Diagnoses:  1. Chronic bilateral low back pain without sciatica   2. Left knee pain, unspecified chronicity     Plan:    Lower back pain- upon review of x-rays obtained in office today, no obvious spondylolisthesis. Will send to PT for SI joint dysfunction. If pain persists, will obtain MRI Left knee pain- appears meniscal in nature. No swelling or mechanical symptoms. Will refer to PT for isometric quad strengthening and trial mobic for pain. If no improvement, will obtain MRI.   Meds & Orders:  Meds ordered this encounter  Medications  . meloxicam (MOBIC) 15 MG tablet    Sig: Take 0.5-1 tablets (7.5-15 mg total) by mouth daily as needed for pain.    Dispense:  30 tablet    Refill:  6    Orders Placed This Encounter  Procedures  . XR Lumbar Spine 2-3 Views  . XR Knee 1-2 Views Left  . Ambulatory referral to Physical Therapy    Follow-up: No follow-ups on file.   Procedures: No procedures performed  No notes on file   Clinical History: No specialty comments available.   She reports that she has never smoked. She has never used smokeless tobacco. No results for input(s): HGBA1C, LABURIC in the last 8760 hours.  Objective:  VS:  HT:    WT:   BMI:     BP:   HR: bpm  TEMP: ( )  RESP:  Physical Exam  PHYSICAL EXAM: Gen: NAD, alert, cooperative with exam, well-appearing  HEENT: clear conjunctiva,  CV:  no edema, capillary refill brisk, normal rate Resp: non-labored Skin: no rashes, normal turgor  Neuro: no gross deficits.  Psych:  alert and oriented  Ortho Exam  Left Knee: - Inspection: no gross deformity. No swelling/effusion, erythema or bruising. Skin intact - Palpation: TTP along medial joint line - ROM: full active ROM with flexion and extension in knee and hip - Strength: 5/5 strength - Neuro/vasc: NV intact - Special Tests: - LIGAMENTS: negative anterior and posterior drawer, no MCL or LCL laxity  -- MENISCUS: pain with McMurray and Thessaly  -- PF JOINT: nml patellar mobility bilaterally  Lumbar spine: - Inspection: no gross deformity or asymmetry, swelling or ecchymosis -  Palpation:TTP over L5 and right SI joint - ROM: full active ROM of the lumbar spine in flexion and extension without pain - Strength: 5/5 strength of lower extremity in L4-S1 nerve root distributions b/l; normal gait - Neuro: sensation intact in the L4-S1 nerve root distribution b/l, 2+ L4 and S1 reflexes - Special testing: Negative straight leg raise, negative slump, negative FABER Stork test- pain in L SI joint with R and L side loading,  Imaging: X-ray L knee- no abnormality X-ray spine- possible spondylosis at L5-S1   Past Medical/Family/Surgical/Social History: Medications & Allergies reviewed per EMR, new medications updated. Patient Active Problem List   Diagnosis Date Noted  . Intramural leiomyoma of uterus 05/14/2016  . Bloating symptom 09/21/2015  . Epigastric pain 09/21/2015  . Excessive and frequent menstruation with regular cycle 09/21/2015   Past Medical History:  Diagnosis Date  . Anemia    09-07-2017 per pt mild  . Endometrial polyp   . Metrorrhagia    Family History  Problem Relation Age of Onset  . Cancer Maternal Grandmother        SMALL INTESTINE-   . Cancer Maternal Grandfather        PROSTATE- LYMPHOMA   Past Surgical History:  Procedure Laterality Date  . APPENDECTOMY  2006  . DILATATION & CURETTAGE/HYSTEROSCOPY WITH MYOSURE N/A 06/20/2016   Procedure: DILATATION & CURETTAGE/HYSTEROSCOPY WITH MYOSURE;  Surgeon: Terrance Mass, MD;  Location: [facility];  Service: Gynecology;  Laterality: N/A;    . DILATATION & CURETTAGE/HYSTEROSCOPY WITH MYOSURE N/A 09/18/2017   Procedure: DILATATION & CURETTAGE/HYSTEROSCOPY WITH MYOSURE;  Surgeon: [name], MD;  Location: [facility];  Service: Gynecology;  Laterality: N/A;  requests 7:30am OR time Requests one hour  . TONSILLECTOMY  age 67   Social History   Occupational History  . Not on file  Tobacco Use  . Smoking status: Never Smoker  . Smokeless tobacco: Never Used  Substance and  Sexual Activity  . Alcohol use: No  . Drug use: No  . Sexual activity: Yes    Partners: Male    Birth control/protection: Condom    Comment: IST INTERCOURSE- 24, PARTNERS - 6, current partner- 2 months

## 2019-07-20 ENCOUNTER — Ambulatory Visit: Payer: 59 | Admitting: Women's Health

## 2019-07-20 ENCOUNTER — Encounter: Payer: Self-pay | Admitting: Women's Health

## 2019-07-20 VITALS — BP 118/78

## 2019-07-20 DIAGNOSIS — B3731 Acute candidiasis of vulva and vagina: Secondary | ICD-10-CM

## 2019-07-20 DIAGNOSIS — B373 Candidiasis of vulva and vagina: Secondary | ICD-10-CM | POA: Diagnosis not present

## 2019-07-20 DIAGNOSIS — Z113 Encounter for screening for infections with a predominantly sexual mode of transmission: Secondary | ICD-10-CM | POA: Diagnosis not present

## 2019-07-20 LAB — WET PREP FOR TRICH, YEAST, CLUE

## 2019-07-20 MED ORDER — FLUCONAZOLE 150 MG PO TABS: 150.0000 mg | ORAL_TABLET | Freq: Once | ORAL | 1 refills | Status: AC

## 2019-07-20 NOTE — Patient Instructions (Signed)
Vaginal Yeast Infection, Adult  Vaginal yeast infection is a condition that causes vaginal discharge as well as soreness, swelling, and redness (inflammation) of the vagina. This is a common condition. Some women get this infection frequently. What are the causes? This condition is caused by a change in the normal balance of the yeast (candida) and bacteria that live in the vagina. This change causes an overgrowth of yeast, which causes the inflammation. What increases the risk? The condition is more likely to develop in women who:  Take antibiotic medicines.  Have diabetes.  Take birth control pills.  Are pregnant.  Douche often.  Have a weak body defense system (immune system).  Have been taking steroid medicines for a long time.  Frequently wear tight clothing. What are the signs or symptoms? Symptoms of this condition include:  White, thick, creamy vaginal discharge.  Swelling, itching, redness, and irritation of the vagina. The lips of the vagina (vulva) may be affected as well.  Pain or a burning feeling while urinating.  Pain during sex. How is this diagnosed? This condition is diagnosed based on:  Your medical history.  A physical exam.  A pelvic exam. Your health care provider will examine a sample of your vaginal discharge under a microscope. Your health care provider may send this sample for testing to confirm the diagnosis. How is this treated? This condition is treated with medicine. Medicines may be over-the-counter or prescription. You may be told to use one or more of the following:  Medicine that is taken by mouth (orally).  Medicine that is applied as a cream (topically).  Medicine that is inserted directly into the vagina (suppository). Follow these instructions at home:  Lifestyle  Do not have sex until your health care provider approves. Tell your sex partner that you have a yeast infection. That person should go to his or her health care  provider and ask if they should also be treated.  Do not wear tight clothes, such as pantyhose or tight pants.  Wear breathable cotton underwear. General instructions  Take or apply over-the-counter and prescription medicines only as told by your health care provider.  Eat more yogurt. This may help to keep your yeast infection from returning.  Do not use tampons until your health care provider approves.  Try taking a sitz bath to help with discomfort. This is a warm water bath that is taken while you are sitting down. The water should only come up to your hips and should cover your buttocks. Do this 3-4 times per day or as told by your health care provider.  Do not douche.  If you have diabetes, keep your blood sugar levels under control.  Keep all follow-up visits as told by your health care provider. This is important. Contact a health care provider if:  You have a fever.  Your symptoms go away and then return.  Your symptoms do not get better with treatment.  Your symptoms get worse.  You have new symptoms.  You develop blisters in or around your vagina.  You have blood coming from your vagina and it is not your menstrual period.  You develop pain in your abdomen. Summary  Vaginal yeast infection is a condition that causes discharge as well as soreness, swelling, and redness (inflammation) of the vagina.  This condition is treated with medicine. Medicines may be over-the-counter or prescription.  Take or apply over-the-counter and prescription medicines only as told by your health care provider.  Do not douche.   Do not have sex or use tampons until your health care provider approves.  Contact a health care provider if your symptoms do not get better with treatment or your symptoms go away and then return. This information is not intended to replace advice given to you by your health care provider. Make sure you discuss any questions you have with your health care  provider. Document Revised: 01/28/2019 Document Reviewed: 11/16/2017 Elsevier Patient Education  2020 Elsevier Inc.  

## 2019-07-20 NOTE — Progress Notes (Signed)
29 yo SWF G0 presents for vaginal discharge and irregular menses in December after using Plan B December 12 after unprotected intercourse with new partner.  Had used Plan B in the past with no bleeding afterwards. . Previously had regular monthly cycles after losing 22 pounds.   Normally uses condoms, reports weight gain with OCP in past. History includes endometrial polyp, D&C with myosure, and irregular cycles. Denies urinary symptoms, abdominal pain, fever. Works in Dentist at [employer].   Exam: Appears well. External genitalia within normal limits, erythemic at introitus. Speculum exam vaginal walls erythemic, white curdy discharge noted, Wet prep positive for yeast.  GC/chlamydia culture taken.  Bimanual no CMT or adnexal tenderness.   Vaginal Candidiasis  STD screen   Plan: Diflucan 150 mg x1. GC/chlamydia/RPR/HIV pending. Encouraged condom use. Discussed contraception options declines other options will continue with condoms. Reviewed having another cycle with morning after pill is common.  Overdue for annual exam,   has scheduled in March,  instructed to keep.

## 2019-07-21 LAB — CHLAMYDIA PROBE AMP THINPREP: C. trachomatis RNA, TMA: NOT DETECTED

## 2019-07-21 LAB — HIV ANTIBODY (ROUTINE TESTING W REFLEX): HIV 1&2 Ab, 4th Generation: NONREACTIVE

## 2019-07-21 LAB — GC PROBE AMP THINPREP: N. gonorrhoeae RNA, TMA: NOT DETECTED

## 2019-07-21 LAB — RPR: RPR Ser Ql: NONREACTIVE

## 2019-08-04 ENCOUNTER — Ambulatory Visit: Payer: 59 | Admitting: Physical Therapy

## 2019-08-05 ENCOUNTER — Ambulatory Visit (INDEPENDENT_AMBULATORY_CARE_PROVIDER_SITE_OTHER): Payer: 59 | Admitting: Physical Therapy

## 2019-08-05 ENCOUNTER — Other Ambulatory Visit: Payer: Self-pay

## 2019-08-05 ENCOUNTER — Encounter: Payer: Self-pay | Admitting: Physical Therapy

## 2019-08-05 DIAGNOSIS — M25562 Pain in left knee: Secondary | ICD-10-CM | POA: Diagnosis not present

## 2019-08-05 DIAGNOSIS — M6281 Muscle weakness (generalized): Secondary | ICD-10-CM | POA: Diagnosis not present

## 2019-08-05 DIAGNOSIS — G8929 Other chronic pain: Secondary | ICD-10-CM

## 2019-08-05 DIAGNOSIS — M545 Low back pain: Secondary | ICD-10-CM | POA: Diagnosis not present

## 2019-08-05 NOTE — Therapy (Signed)
[facility] [address] [place], [place], [address] Phone: [phone]   Fax:  [phone]  Physical Therapy Evaluation  Patient Details  Name: Dana Manning MRN: [id number] Date of Birth: 1991/01/28 Referring Provider (PT): HIlts   Encounter Date: 08/05/2019  PT End of Session - 08/05/19 0958    Visit Number  1    Number of Visits  12    Date for PT Re-Evaluation  09/16/19    Authorization Type  [facility]    PT Start Time  0845    PT Stop Time  0942    PT Time Calculation (min)  57 min    Activity Tolerance  Patient tolerated treatment well    Behavior During Therapy  [facility] for tasks assessed/performed       Past Medical History:  Diagnosis Date  . Anemia    09-07-2017 per pt mild  . Endometrial polyp   . Metrorrhagia     Past Surgical History:  Procedure Laterality Date  . APPENDECTOMY  2006  . DILATATION & CURETTAGE/HYSTEROSCOPY WITH MYOSURE N/A 06/20/2016   Procedure: DILATATION & CURETTAGE/HYSTEROSCOPY WITH MYOSURE;  Surgeon: Terrance Mass, MD;  Location: [facility];  Service: Gynecology;  Laterality: N/A;    . DILATATION & CURETTAGE/HYSTEROSCOPY WITH MYOSURE N/A 09/18/2017   Procedure: DILATATION & CURETTAGE/HYSTEROSCOPY WITH MYOSURE;  Surgeon: [name], MD;  Location: [facility];  Service: Gynecology;  Laterality: N/A;  requests 7:30am OR time Requests one hour  . TONSILLECTOMY  age 29    There were no vitals filed for this visit.   Subjective Assessment - 08/05/19 0846    Subjective  She reports that she has had pain in lower back for the past 2 years. She fell off a step stool at work in October 2018, landed on her buttocks. Has tried PT in the past (had some benefit from DN and stretching) and has tried chiropractor care with temporary relief. She was told she had spondylolisthesis by chiropractor but MD just took recent lumbar XR and his impression was "no obvious spondylolisthesis". Lt knee pain  anteriorly and medially over last 3 weeks without known cause of injury. She has more knee pain if she sits too long and then tries to stand up. She denies her knee pops, clicks, catches, or gives way. Her back pain is aggravated with standing too long or if she does lower body exercises. She gets relief from back with heat and rest, she gets relief from knee with walking    Pertinent History  no significant PMH    Diagnostic tests  recent lumbar XR and his impression was "no obvious spondylolisthesis    Patient Stated Goals  get rid of the pain, strengthen core    Currently in Pain?  Yes    Pain Score  6    Back and Knee   Pain Location  Back    Pain Orientation  Lower    Pain Descriptors / Indicators  Aching;Dull    Pain Type  Chronic pain    Pain Radiating Towards  denies radiculopathy, but does have localized    Pain Onset  1 to 4 weeks ago    Pain Frequency  Constant   constant LBP, intermittent knee pain        [facility] PT Assessment - 08/05/19 0001      Assessment   Medical Diagnosis  Chronic LBP, Lt knee pain    Referring Provider (PT)  HIlts    Next MD  Visit  PRN    Prior Therapy  PT in past, got some relief from stretching and DN      Balance Screen   Has the patient fallen in the past 6 months  No      [place] residence      Prior Function   Level of Independence  Independent    Vocation  Full time employment    Vocation Requirements  mostly sitting work for [employer]    Leisure  works out with trainier 2-3 times per week      Cognition   Overall Cognitive Status  Within Functional Limits for tasks assessed      Posture/Postural Control   Posture Comments  WNL      ROM / Strength   AROM / PROM / Strength  AROM;Strength      AROM   Overall AROM Comments  Lt knee ROM WFL    AROM Assessment Site  Lumbar    Lumbar Flexion  75%    Lumbar Extension  50%   pain in center of low back   Lumbar - Right Side Bend  WFL     Lumbar - Left Side Bend  50%   pain   Lumbar - Right Rotation  WFL    Lumbar - Left Rotation  WFL      Strength   Overall Strength Comments  knee strength 5/5 MMT bilat    Strength Assessment Site  Hip    Right/Left Hip  Right;Left    Right Hip Flexion  4+/5    Right Hip Extension  4/5    Right Hip ABduction  4+/5    Right Hip ADduction  4/5    Left Hip Flexion  4-/5    Left Hip Extension  4-/5    Left Hip ABduction  4/5    Left Hip ADduction  4-/5      Flexibility   Soft Tissue Assessment /Muscle Length  --   tight hamstrings bilat, IT band on Lt     Palpation   Patella mobility  good mobility but mild patella tracking latreally on Lt knee    Spinal mobility  WNL    Palpation comment  TTP at L5,S1 with passive accessory motion testing      Special Tests   Other special tests  negative slump test, neg SLR test, + quadrant test for facet arthropathy, pain with extension movements, relief with flexion movements, pain with long axis distraction, can hold plank 34 seconds, attempted side plank but bothered her hips      Transfers   Transfers  Independent with all Transfers      Ambulation/Gait   Gait Comments  WNL pattern and velocity                Objective measurements completed on examination: See above findings.      [facility] Adult PT Treatment/Exercise - 08/05/19 0001      Modalities   Modalities  Moist Heat      Moist Heat Therapy   Number Minutes Moist Heat  10 Minutes    Moist Heat Location  Lumbar Spine;Knee             PT Education - 08/05/19 0957    Education Details  HEP, POC, exam findings    Person(s) Educated  Patient    Methods  Explanation;Demonstration;Verbal cues;Handout    Comprehension  Verbalized understanding;Need further instruction  PT Long Term Goals - 08/05/19 1004      PT LONG TERM GOAL #1   Title  Pt will be I and compliant with HEP. (target for all goals 6 weeks 09/16/19)    Status  New      PT LONG  TERM GOAL #2   Title  Pt will reduce overall pain to less than 2-3/10 with usual activity, and have decreased knee pain upon standing after sitting for long time    Status  New      PT LONG TERM GOAL #3   Title  Pt will improve hip strength to 5/5 MMT all planes to improve stability and function      PT LONG TERM GOAL #4   Title  Pt will improve core strength by holding a plank 60 seconds to improve overall function.    Status  New             Plan - 08/05/19 0959    Clinical Impression Statement  Pt presents with chronic LBP with signs and symptoms consistent with facet arthropathy and strain, as well as subacute Lt knee pain consistent with patella femoral pain syndrome with mild lateral patella tracking. She has overall decresaed lumbar ROM, moderate hip weakness (Lt is weaker than Rt), mild core weakness, lumbar/hip tightness, and increased pain limiting her function. She will benefit from skilled PT to address her deficits.    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    Clinical Decision Making  Moderate    Rehab Potential  Good    PT Frequency  2x / week   1-2   PT Duration  6 weeks    PT Treatment/Interventions  ADLs/Self Care Home Management;Cryotherapy;Electrical Stimulation;Iontophoresis 4mg /ml Dexamethasone;Moist Heat;Traction;Ultrasound;Neuromuscular re-education;Therapeutic exercise;Therapeutic activities;Patient/family education;Manual techniques;Dry needling;Passive range of motion;Taping;Spinal Manipulations;Joint Manipulations    PT Next Visit Plan  DN PRN, responded better to flexion based lumbar program during eval, needs hip/core strength    PT Home Exercise Plan  Access Code: [email or web address]    Consulted and Agree with Plan of Care  Patient       Patient will benefit from skilled therapeutic intervention in order  to improve the following deficits and impairments:  Decreased activity tolerance, Decreased endurance, Decreased range of motion, Decreased strength, Increased fascial restricitons, Impaired flexibility, Pain  Visit Diagnosis: Chronic bilateral low back pain without sciatica  Acute pain of left knee  Muscle weakness (generalized)     Problem List Patient Active Problem List   Diagnosis Date Noted  . Intramural leiomyoma of uterus 05/14/2016  . Bloating symptom 09/21/2015  . Epigastric pain 09/21/2015  . Excessive and frequent menstruation with regular cycle 09/21/2015    Silvestre Mesi 08/05/2019, 10:17 AM  [facility] Physical Therapy [address] [place], [place], [address] Phone: [phone]   Fax:  [phone]  Name: Dana Manning MRN: [id number] Date of Birth: 11-15-1990

## 2019-08-05 NOTE — Patient Instructions (Signed)
Access Code: [email or web address]  URL: [email or web address]  Date: 08/05/2019  Prepared by: Elsie Ra   Exercises  Seated Hamstring Stretch - 2 reps - 1 sets - 30 hold - 2x daily - 6x weekly  Supine Single Knee to Chest Stretch - 2 reps - 1 sets - 3- hold - 2x daily - 6x weekly  Supine ITB Stretch with Strap - 2-3 reps - 30 hold - 2x daily - 6x weekly  Child's Pose Stretch - 3 sets - 30 hold - 2x daily - 6x weekly  Supine Bridge - 10 reps - 1-2 sets - 5 hold - 2x daily - 6x weekly  Sidelying Hip Abduction - 10 reps - 1-3 sets - 2x daily - 6x weekly  Supine Active Straight Leg Raise - 10 reps - 1-2 sets - 2x daily - 6x weekly  Sidelying Hip Adduction - 10 reps - 3 sets - 2x daily - 6x weekly  Bird Dog - 10 reps - 1 sets - 5 sec hold - 2x daily - 6x weekly  Sit to Stand with Ball Between Knees - 10 reps - 2-3 sets - 2x daily - 6x weekly

## 2019-08-09 ENCOUNTER — Ambulatory Visit: Payer: 59 | Admitting: Family Medicine

## 2019-08-17 ENCOUNTER — Encounter: Payer: Self-pay | Admitting: Physical Therapy

## 2019-08-17 ENCOUNTER — Ambulatory Visit (INDEPENDENT_AMBULATORY_CARE_PROVIDER_SITE_OTHER): Payer: 59 | Admitting: Physical Therapy

## 2019-08-17 ENCOUNTER — Other Ambulatory Visit: Payer: Self-pay

## 2019-08-17 DIAGNOSIS — M545 Low back pain, unspecified: Secondary | ICD-10-CM

## 2019-08-17 DIAGNOSIS — M25562 Pain in left knee: Secondary | ICD-10-CM | POA: Diagnosis not present

## 2019-08-17 DIAGNOSIS — M6281 Muscle weakness (generalized): Secondary | ICD-10-CM | POA: Diagnosis not present

## 2019-08-17 DIAGNOSIS — G8929 Other chronic pain: Secondary | ICD-10-CM | POA: Diagnosis not present

## 2019-08-17 NOTE — Therapy (Signed)
[facility] Therapy [address] [place], [place], [address] Phone: [phone]   Fax:  [phone]  Physical Therapy Treatment  Patient Details  Name: Dana Manning MRN: [id number] Date of Birth: 1991/06/29 Referring Provider (PT): HIlts   Encounter Date: 08/17/2019  PT End of Session - 08/17/19 1227    Visit Number  2    Number of Visits  12    Date for PT Re-Evaluation  09/16/19    Authorization Type  [facility]    PT Start Time  [phone]   pt arrived late   PT Stop Time  1008    PT Time Calculation (min)  30 min    Activity Tolerance  Patient tolerated treatment well    Behavior During Therapy  [facility] for tasks assessed/performed       Past Medical History:  Diagnosis Date  . Anemia    09-07-2017 per pt mild  . Endometrial polyp   . Metrorrhagia     Past Surgical History:  Procedure Laterality Date  . APPENDECTOMY  2006  . DILATATION & CURETTAGE/HYSTEROSCOPY WITH MYOSURE N/A 06/20/2016   Procedure: DILATATION & CURETTAGE/HYSTEROSCOPY WITH MYOSURE;  Surgeon: Terrance Mass, MD;  Location: [facility];  Service: Gynecology;  Laterality: N/A;    . DILATATION & CURETTAGE/HYSTEROSCOPY WITH MYOSURE N/A 09/18/2017   Procedure: DILATATION & CURETTAGE/HYSTEROSCOPY WITH MYOSURE;  Surgeon: [name], MD;  Location: [place];  Service: Gynecology;  Laterality: N/A;  requests 7:30am OR time Requests one hour  . TONSILLECTOMY  age 36    There were no vitals filed for this visit.  Subjective Assessment - 08/17/19 0940    Subjective  knee feels much better (not bothering her as much), back is still a little sore.  tries to do the exercises but "they aggravate my back more."    Pertinent History  no significant PMH    Diagnostic tests  recent lumbar XR and his impression was "no obvious spondylolisthesis    Patient Stated Goals  get rid of the pain, strengthen core    Currently in Pain?  No/denies    Pain Onset  1 to 4 weeks ago                        [facility] Adult PT Treatment/Exercise - 08/17/19 0941      Self-Care   Self-Care  Other Self-Care Comments    Other Self-Care Comments   verbally reviewed current HEP with pt (for exercises not performed below) and recommended she hold on the exercises that exacerbate her pain for now, and will review next visit to modify PRN       Exercises   Exercises  Lumbar      Lumbar Exercises: Stretches   Passive Hamstring Stretch  Right;Left;2 reps;20 seconds    Single Knee to Chest Stretch  Right;Left;1 rep;30 seconds    Piriformis Stretch  Right;Left;2 reps;30 seconds      Lumbar Exercises: Aerobic   Recumbent Bike  L2 x 6 min      Lumbar Exercises: Supine   Bridge  5 reps;5 seconds    Bridge Limitations  cues for technique; pt reports still with pain with modification; advised to hold for now      Manual Therapy   Manual Therapy  Soft tissue mobilization    Soft tissue mobilization  Lt QL and lumbar paraspinals       Trigger Point Dry Needling - 08/17/19 1226  Consent Given?  Yes    Education Handout Provided  No   verbally reviewed; will provide; pt has had in past   Muscles Treated Back/Hip  Erector spinae    Erector spinae Response  Twitch response elicited;Palpable increased muscle length                PT Long Term Goals - 08/05/19 1004      PT LONG TERM GOAL #1   Title  Pt will be I and compliant with HEP. (target for all goals 6 weeks 09/16/19)    Status  New      PT LONG TERM GOAL #2   Title  Pt will reduce overall pain to less than 2-3/10 with usual activity, and have decreased knee pain upon standing after sitting for long time    Status  New      PT LONG TERM GOAL #3   Title  Pt will improve hip strength to 5/5 MMT all planes to improve stability and function      PT LONG TERM GOAL #4   Title  Pt will improve core strength by holding a plank 60 seconds to improve overall function.    Status  New            Plan  - 08/17/19 1227    Clinical Impression Statement  Pt tolerated session well today with positive response to DN and manual therapy.  Will continue to benefit from PT to maximize function.  No goals met as only 2nd visit.    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    Rehab Potential  Good    PT Frequency  2x / week   1-2   PT Duration  6 weeks    PT Treatment/Interventions  ADLs/Self Care Home Management;Cryotherapy;Electrical Stimulation;Iontophoresis '4mg'$ /ml Dexamethasone;Moist Heat;Traction;Ultrasound;Neuromuscular re-education;Therapeutic exercise;Therapeutic activities;Patient/family education;Manual techniques;Dry needling;Passive range of motion;Taping;Spinal Manipulations;Joint Manipulations    PT Next Visit Plan  assess response to DN, review/modify HEP PRN    PT Home Exercise Plan  Access Code: [email or web address]    Consulted and Agree with Plan of Care  Patient       Patient will benefit from skilled therapeutic intervention in order to improve the following deficits and impairments:  Decreased activity tolerance, Decreased endurance, Decreased range of motion, Decreased strength, Increased fascial restricitons, Impaired flexibility, Pain  Visit Diagnosis: Chronic bilateral low back pain without sciatica  Acute pain of left knee  Muscle weakness (generalized)     Problem List Patient Active Problem List   Diagnosis Date Noted  . Intramural leiomyoma of uterus 05/14/2016  . Bloating symptom 09/21/2015  . Epigastric pain 09/21/2015  . Excessive and frequent menstruation with regular cycle 09/21/2015      Laureen Abrahams, PT, DPT 08/17/19 12:32 PM     [facility] Physical Therapy [address] [place], [place], [address] Phone: [phone]   Fax:  [phone]  Name: Dana Manning MRN:  [id number] Date of Birth: [birth year]

## 2019-08-23 ENCOUNTER — Encounter: Payer: 59 | Admitting: Physical Therapy

## 2019-08-25 ENCOUNTER — Other Ambulatory Visit: Payer: Self-pay

## 2019-08-25 ENCOUNTER — Ambulatory Visit (INDEPENDENT_AMBULATORY_CARE_PROVIDER_SITE_OTHER): Payer: 59 | Admitting: Physical Therapy

## 2019-08-25 DIAGNOSIS — G8929 Other chronic pain: Secondary | ICD-10-CM | POA: Diagnosis not present

## 2019-08-25 DIAGNOSIS — M6281 Muscle weakness (generalized): Secondary | ICD-10-CM

## 2019-08-25 DIAGNOSIS — M545 Low back pain: Secondary | ICD-10-CM

## 2019-08-25 DIAGNOSIS — M25562 Pain in left knee: Secondary | ICD-10-CM

## 2019-08-25 NOTE — Therapy (Signed)
[facility] Physical Therapy [address] [place], [place], [address] Phone: [phone]   Fax:  [phone]  Physical Therapy Treatment  Patient Details  Name: Dana Manning MRN: [id number] Date of Birth: 25-Apr-1991 Referring Provider (PT): HIlts   Encounter Date: 08/25/2019  PT End of Session - 08/25/19 1336    Visit Number  3    Number of Visits  12    Date for PT Re-Evaluation  09/16/19    Authorization Type  [facility]    PT Start Time  1102    PT Stop Time  1145    PT Time Calculation (min)  43 min    Activity Tolerance  Patient tolerated treatment well    Behavior During Therapy  [facility] for tasks assessed/performed       Past Medical History:  Diagnosis Date  . Anemia    09-07-2017 per pt mild  . Endometrial polyp   . Metrorrhagia     Past Surgical History:  Procedure Laterality Date  . APPENDECTOMY  2006  . DILATATION & CURETTAGE/HYSTEROSCOPY WITH MYOSURE N/A 06/20/2016   Procedure: DILATATION & CURETTAGE/HYSTEROSCOPY WITH MYOSURE;  Surgeon: Terrance Mass, MD;  Location: [facility];  Service: Gynecology;  Laterality: N/A;    . DILATATION & CURETTAGE/HYSTEROSCOPY WITH MYOSURE N/A 09/18/2017   Procedure: DILATATION & CURETTAGE/HYSTEROSCOPY WITH MYOSURE;  Surgeon: [name], MD;  Location: [place];  Service: Gynecology;  Laterality: N/A;  requests 7:30am OR time Requests one hour  . TONSILLECTOMY  age 16    There were no vitals filed for this visit.  Subjective Assessment - 08/25/19 1328    Subjective  her left knee is doing great no pain, but about 4/10 Left sided low back pain.    Pertinent History  no significant PMH    Diagnostic tests  recent lumbar XR and his impression was "no obvious spondylolisthesis    Patient Stated Goals  get rid of the pain, strengthen core    Pain Onset  1 to 4 weeks ago         [facility] Adult PT Treatment/Exercise - 08/25/19 0001      Lumbar Exercises: Stretches   Passive Hamstring  Stretch  Right;Left;2 reps;20 seconds    Single Knee to Chest Stretch  Right;Left;1 rep;30 seconds    Piriformis Stretch  Right;Left;2 reps;30 seconds    Other Lumbar Stretch Exercise  child pose 30 sec X 2 fwd, and X1 with hands to Rt       Lumbar Exercises: Aerobic   Recumbent Bike  L2 x 7 min      Lumbar Exercises: Supine   Bridge Limitations  brigde with alt leg extensions X 5 reps bilat, then regular brigdes with 5 sec hold X 5 reps      Lumbar Exercises: Sidelying   Other Sidelying Lumbar Exercises  sideplank for 20 sec hold but complaints of knee pain so discontinued      Lumbar Exercises: Quadruped   Madcat/Old Horse  5 reps    Madcat/Old Horse Limitations  5 sec ea    Opposite Arm/Leg Raise  10 reps    Plank  mod plank from knees 30 sec X 2      Moist Heat Therapy   Number Minutes Moist Heat  7 Minutes    Moist Heat Location  Lumbar Spine   while on bike     Manual Therapy   Manual therapy comments  STM/IASTM to left lumbar P.S, QL, Left long axis distraciton  both in ER and IR, sidelying lumbar manipulaiton with cavitation noted             PT Education - 08/25/19 1336    Education Details  HEP review and adjustment    Person(s) Educated  Patient    Methods  Explanation;Demonstration;Verbal cues    Comprehension  Verbalized understanding;Returned demonstration          PT Long Term Goals - 08/05/19 1004      PT LONG TERM GOAL #1   Title  Pt will be I and compliant with HEP. (target for all goals 6 weeks 09/16/19)    Status  New      PT LONG TERM GOAL #2   Title  Pt will reduce overall pain to less than 2-3/10 with usual activity, and have decreased knee pain upon standing after sitting for long time    Status  New      PT LONG TERM GOAL #3   Title  Pt will improve hip strength to 5/5 MMT all planes to improve stability and function      PT LONG TERM GOAL #4   Title  Pt will improve core strength by holding a plank 60 seconds to improve overall  function.    Status  New            Plan - 08/25/19 1339    Clinical Impression Statement  Modified HEP some as some of the exercises were aggravating her. She had postive return from session after manual therapy, core stengthening, and stretching. She is overall doing better and will come back in 2 weeks.    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    Rehab Potential  Good    PT Frequency  2x / week   1-2   PT Duration  6 weeks    PT Treatment/Interventions  ADLs/Self Care Home Management;Cryotherapy;Electrical Stimulation;Iontophoresis 4mg /ml Dexamethasone;Moist Heat;Traction;Ultrasound;Neuromuscular re-education;Therapeutic exercise;Therapeutic activities;Patient/family education;Manual techniques;Dry needling;Passive range of motion;Taping;Spinal Manipulations;Joint Manipulations    PT Next Visit Plan  assess response to DN, review/modify HEP PRN    PT Home Exercise Plan  Access Code: [email or web address]    Consulted and Agree with Plan of Care  Patient       Patient will benefit from skilled therapeutic intervention in order to improve the following deficits and impairments:  Decreased activity tolerance, Decreased endurance, Decreased range of motion, Decreased strength, Increased fascial restricitons, Impaired flexibility, Pain  Visit Diagnosis: Chronic bilateral low back pain without sciatica  Acute pain of left knee  Muscle weakness (generalized)     Problem List Patient Active Problem List   Diagnosis Date Noted  . Intramural leiomyoma of uterus 05/14/2016  . Bloating symptom 09/21/2015  . Epigastric pain 09/21/2015  . Excessive and frequent menstruation with regular cycle 09/21/2015    Silvestre Mesi 08/25/2019, 1:52 PM  [facility] Physical Therapy [address] [place], [place],  [address] Phone: [phone]   Fax:  [phone]  Name: Dana Manning MRN: [id number] Date of Birth: [birth year]

## 2019-09-08 ENCOUNTER — Encounter: Payer: Self-pay | Admitting: Rehabilitative and Restorative Service Providers"

## 2019-09-08 ENCOUNTER — Other Ambulatory Visit: Payer: Self-pay

## 2019-09-08 ENCOUNTER — Ambulatory Visit (INDEPENDENT_AMBULATORY_CARE_PROVIDER_SITE_OTHER): Payer: 59 | Admitting: Rehabilitative and Restorative Service Providers"

## 2019-09-08 DIAGNOSIS — M6281 Muscle weakness (generalized): Secondary | ICD-10-CM | POA: Diagnosis not present

## 2019-09-08 DIAGNOSIS — M25562 Pain in left knee: Secondary | ICD-10-CM | POA: Diagnosis not present

## 2019-09-08 DIAGNOSIS — M545 Low back pain, unspecified: Secondary | ICD-10-CM

## 2019-09-08 DIAGNOSIS — G8929 Other chronic pain: Secondary | ICD-10-CM

## 2019-09-08 NOTE — Therapy (Signed)
[facility] Physical Therapy [address] [place], [place], [address] Phone: [phone]   Fax:  [phone]  Physical Therapy Treatment  Patient Details  Name: Dana Manning MRN: [id number] Date of Birth: 1990/09/24 Referring Provider (PT): HIlts   Encounter Date: 09/08/2019  PT End of Session - 09/08/19 1220    Visit Number  4    Number of Visits  12    Date for PT Re-Evaluation  09/16/19    Authorization Type  [facility]    PT Start Time  [id number]    PT Stop Time  1228    PT Time Calculation (min)  43 min    Activity Tolerance  Patient tolerated treatment well    Behavior During Therapy  [facility] for tasks assessed/performed       Past Medical History:  Diagnosis Date  . Anemia    09-07-2017 per pt mild  . Endometrial polyp   . Metrorrhagia     Past Surgical History:  Procedure Laterality Date  . APPENDECTOMY  2006  . DILATATION & CURETTAGE/HYSTEROSCOPY WITH MYOSURE N/A 06/20/2016   Procedure: DILATATION & CURETTAGE/HYSTEROSCOPY WITH MYOSURE;  Surgeon: Terrance Mass, MD;  Location: [facility];  Service: Gynecology;  Laterality: N/A;    . DILATATION & CURETTAGE/HYSTEROSCOPY WITH MYOSURE N/A 09/18/2017   Procedure: DILATATION & CURETTAGE/HYSTEROSCOPY WITH MYOSURE;  Surgeon: [name], MD;  Location: [place];  Service: Gynecology;  Laterality: N/A;  requests 7:30am OR time Requests one hour  . TONSILLECTOMY  age 21    There were no vitals filed for this visit.  Subjective Assessment - 09/08/19 1147    Subjective  Pt. indicated L sided back pain/soreness at 5/10 or so today.    Pertinent History  no significant PMH    Diagnostic tests  recent lumbar XR and his impression was "no obvious spondylolisthesis    Patient Stated Goals  get rid of the pain, strengthen core    Currently in Pain?  Yes    Pain Score  6     Pain Location  Back    Pain Orientation  Lower    Pain Descriptors / Indicators  Aching;Dull    Pain Type  Chronic  pain    Pain Onset  1 to 4 weeks ago                       [facility] Adult PT Treatment/Exercise - 09/08/19 0001      Lumbar Exercises: Stretches   Single Knee to Chest Stretch  Left;Right;2 reps;30 seconds    Other Lumbar Stretch Exercise  bilateral sidelying regional lumbar rotation 15 sec x 5      Lumbar Exercises: Aerobic   Nustep  L6 10 mins UE/LE      Lumbar Exercises: Supine   Bridge  15 reps      Manual Therapy   Manual therapy comments  STM, compression to Lt QL trigger point       Trigger Point Dry Needling - 09/08/19 0001    Consent Given?  Yes    Education Handout Provided  Previously provided    Muscles Treated Back/Hip  Quadratus lumborum    Dry Needling Comments  Strong twithc response    Quadratus Lumborum Response  Twitch response elicited           PT Education - 09/08/19 1148    Education Details  Cues for intervention.    Person(s) Educated  Patient    Methods  Explanation;Demonstration    Comprehension  Verbalized understanding          PT Long Term Goals - 08/05/19 1004      PT LONG TERM GOAL #1   Title  Pt will be I and compliant with HEP. (target for all goals 6 weeks 09/16/19)    Status  New      PT LONG TERM GOAL #2   Title  Pt will reduce overall pain to less than 2-3/10 with usual activity, and have decreased knee pain upon standing after sitting for long time    Status  New      PT LONG TERM GOAL #3   Title  Pt will improve hip strength to 5/5 MMT all planes to improve stability and function      PT LONG TERM GOAL #4   Title  Pt will improve core strength by holding a plank 60 seconds to improve overall function.    Status  New            Plan - 09/08/19 1218    Clinical Impression Statement  Fair tolerance to trigger point release techniques but reported strong concordant pain symptoms from Lt QL at this time.  Additional time spent in Pt. education on presentation and differences between original pain  complaints and myofascial release soreness post intevention.    Continued myofascial mobility improvements warrented, paired c core stability.    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    PT Home Exercise Plan  Access Code: [email or web address]    Consulted and Agree with Plan of Care  Patient       Patient will benefit from skilled therapeutic intervention in order to improve the following deficits and impairments:  Decreased activity tolerance, Decreased endurance, Decreased range of motion, Decreased strength, Increased fascial restricitons, Impaired flexibility, Pain  Visit Diagnosis: Chronic bilateral low back pain without sciatica  Acute pain of left knee  Muscle weakness (generalized)     Problem List Patient Active Problem List   Diagnosis Date Noted  . Intramural leiomyoma of uterus 05/14/2016  . Bloating symptom 09/21/2015  . Epigastric pain 09/21/2015  . Excessive and frequent menstruation with regular cycle 09/21/2015    Scot Jun, PT, DPT, OCS, ATC 09/08/19  12:22 PM    [facility] [address] [place], [place], [address] Phone: [phone]   Fax:  [phone]  Name: Dana Manning MRN: [id number] Date of Birth: 1991-04-30

## 2019-09-20 ENCOUNTER — Encounter: Payer: Self-pay | Admitting: Rehabilitative and Restorative Service Providers"

## 2019-09-20 ENCOUNTER — Other Ambulatory Visit: Payer: Self-pay

## 2019-09-20 ENCOUNTER — Ambulatory Visit (INDEPENDENT_AMBULATORY_CARE_PROVIDER_SITE_OTHER): Payer: 59 | Admitting: Rehabilitative and Restorative Service Providers"

## 2019-09-20 DIAGNOSIS — G8929 Other chronic pain: Secondary | ICD-10-CM | POA: Diagnosis not present

## 2019-09-20 DIAGNOSIS — M6281 Muscle weakness (generalized): Secondary | ICD-10-CM | POA: Diagnosis not present

## 2019-09-20 DIAGNOSIS — M25562 Pain in left knee: Secondary | ICD-10-CM

## 2019-09-20 DIAGNOSIS — M545 Low back pain, unspecified: Secondary | ICD-10-CM

## 2019-09-20 NOTE — Therapy (Signed)
[facility] Physical Therapy [address] [place], [place], [address] Phone: [phone]   Fax:  [phone]  Physical Therapy Treatment  Patient Details  Name: Dana Manning MRN: [id number] Date of Birth: [birth year] Referring Provider (PT): HIlts   Encounter Date: 09/20/2019  PT End of Session - 09/20/19 1502    Visit Number  5    Number of Visits  12    Date for PT Re-Evaluation  09/16/19    Authorization Type  [facility]    PT Start Time  1448    PT Stop Time  1537    PT Time Calculation (min)  49 min    Activity Tolerance  Patient tolerated treatment well    Behavior During Therapy  [facility] for tasks assessed/performed       Past Medical History:  Diagnosis Date  . Anemia    09-07-2017 per pt mild  . Endometrial polyp   . Metrorrhagia     Past Surgical History:  Procedure Laterality Date  . APPENDECTOMY  2006  . DILATATION & CURETTAGE/HYSTEROSCOPY WITH MYOSURE N/A 06/20/2016   Procedure: DILATATION & CURETTAGE/HYSTEROSCOPY WITH MYOSURE;  Surgeon: Terrance Mass, MD;  Location: [facility];  Service: Gynecology;  Laterality: N/A;    . DILATATION & CURETTAGE/HYSTEROSCOPY WITH MYOSURE N/A 09/18/2017   Procedure: DILATATION & CURETTAGE/HYSTEROSCOPY WITH MYOSURE;  Surgeon: [name], MD;  Location: [place];  Service: Gynecology;  Laterality: N/A;  requests 7:30am OR time Requests one hour  . TONSILLECTOMY  age 69    There were no vitals filed for this visit.  Subjective Assessment - 09/20/19 1500    Subjective  Pt. reported feeling improvement in symptoms since last visit.  Stated DN seemed to help.    Pertinent History  no significant PMH    Diagnostic tests  recent lumbar XR and his impression was "no obvious spondylolisthesis    Patient Stated Goals  get rid of the pain, strengthen core    Pain Score  2     Pain Location  Back    Pain Orientation  Lower    Pain Onset  1 to 4 weeks ago                        [facility] Adult PT Treatment/Exercise - 09/20/19 0001      Lumbar Exercises: Stretches   Single Knee to Chest Stretch  3 reps;30 seconds;Left;Right    Lower Trunk Rotation  5 reps;Other (comment)   15 seconds     Lumbar Exercises: Aerobic   Tread Mill  Self selected walking pace 10 mins      Lumbar Exercises: Sidelying   Other Sidelying Lumbar Exercises  lumbar rotation stretch 15 sec x 5 bilaterally      Lumbar Exercises: Quadruped   Opposite Arm/Leg Raise  Left arm/Right leg;Right arm/Left leg;15 reps;3 seconds      Modalities   Modalities  Electrical Stimulation      Moist Heat Therapy   Number Minutes Moist Heat  10 Minutes    Moist Heat Location  Lumbar Spine      Electrical Stimulation   Electrical Stimulation Location  Lumbar    Electrical Stimulation Action  IFC     Electrical Stimulation Parameters  to tolerance    Electrical Stimulation Goals  Pain      Manual Therapy   Manual therapy comments  STM, compression to Lt QL trigger point       Trigger Point  Dry Needling - 09/20/19 0001    Consent Given?  Yes    Education Handout Provided  Previously provided    Muscles Treated Back/Hip  Quadratus lumborum    Dry Needling Comments  Strong twithc response    Quadratus Lumborum Response  Twitch response elicited           PT Education - 09/20/19 1501    Education Details  Dn soreness care reviewed    Person(s) Educated  Patient    Methods  Explanation;Demonstration    Comprehension  Verbalized understanding          PT Long Term Goals - 08/05/19 1004      PT LONG TERM GOAL #1   Title  Pt will be I and compliant with HEP. (target for all goals 6 weeks 09/16/19)    Status  New      PT LONG TERM GOAL #2   Title  Pt will reduce overall pain to less than 2-3/10 with usual activity, and have decreased knee pain upon standing after sitting for long time    Status  New      PT LONG TERM GOAL #3   Title  Pt will improve hip  strength to 5/5 MMT all planes to improve stability and function      PT LONG TERM GOAL #4   Title  Pt will improve core strength by holding a plank 60 seconds to improve overall function.    Status  New            Plan - 09/20/19 1511    Clinical Impression Statement  Pt. demonstrated/reported improvement in symptoms from QL release techniques including DN.  Advised continued treatment as appropriate to improve trigger point release and mobility to affected area, paired c mobility stretching and core stabilty.    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    Rehab Potential  Good    PT Frequency  2x / week   1-2   PT Duration  6 weeks    PT Treatment/Interventions  ADLs/Self Care Home Management;Cryotherapy;Electrical Stimulation;Iontophoresis 4mg /ml Dexamethasone;Moist Heat;Traction;Ultrasound;Neuromuscular re-education;Therapeutic exercise;Therapeutic activities;Patient/family education;Manual techniques;Dry needling;Passive range of motion;Taping;Spinal Manipulations;Joint Manipulations    PT Next Visit Plan  DN QL prn, promote improve core strength control    PT Home Exercise Plan  Access Code: [email or web address]    Consulted and Agree with Plan of Care  Patient       Patient will benefit from skilled therapeutic intervention in order to improve the following deficits and impairments:  Decreased activity tolerance, Decreased endurance, Decreased range of motion, Decreased strength, Increased fascial restricitons, Impaired flexibility, Pain  Visit Diagnosis: Chronic bilateral low back pain without sciatica  Acute pain of left knee  Muscle weakness (generalized)     Problem List Patient Active Problem List   Diagnosis Date Noted  . Intramural leiomyoma of uterus 05/14/2016  . Bloating symptom 09/21/2015  . Epigastric pain  09/21/2015  . Excessive and frequent menstruation with regular cycle 09/21/2015    Scot Jun, PT, DPT, OCS, ATC 09/20/19  3:29 PM    [facility] [address] [place], [place], [address] Phone: [phone]   Fax:  [phone]  Name: Dana Manning MRN: [id number] Date of Birth: 05-05-1991

## 2019-09-21 ENCOUNTER — Other Ambulatory Visit: Payer: Self-pay

## 2019-09-22 ENCOUNTER — Ambulatory Visit (INDEPENDENT_AMBULATORY_CARE_PROVIDER_SITE_OTHER): Payer: 59 | Admitting: Obstetrics & Gynecology

## 2019-09-22 ENCOUNTER — Encounter: Payer: Self-pay | Admitting: Obstetrics & Gynecology

## 2019-09-22 VITALS — BP 118/80 | Ht 61.0 in | Wt 145.0 lb

## 2019-09-22 DIAGNOSIS — N921 Excessive and frequent menstruation with irregular cycle: Secondary | ICD-10-CM | POA: Diagnosis not present

## 2019-09-22 DIAGNOSIS — Z789 Other specified health status: Secondary | ICD-10-CM

## 2019-09-22 DIAGNOSIS — B3731 Acute candidiasis of vulva and vagina: Secondary | ICD-10-CM

## 2019-09-22 DIAGNOSIS — Z01419 Encounter for gynecological examination (general) (routine) without abnormal findings: Secondary | ICD-10-CM | POA: Diagnosis not present

## 2019-09-22 DIAGNOSIS — B373 Candidiasis of vulva and vagina: Secondary | ICD-10-CM | POA: Diagnosis not present

## 2019-09-22 MED ORDER — FLUCONAZOLE 150 MG PO TABS: 150.0000 mg | ORAL_TABLET | Freq: Every day | ORAL | 3 refills | Status: AC

## 2019-09-22 NOTE — Progress Notes (Signed)
Dana Manning June 23, 1991 [id number]   History:    29 y.o. G0  Single.  New boyfriend x 2 months  RP:  Established patient presenting for annual gyn exam   HPI: Menses regular every month, with normal flow, but still experiencing breakthrough bleeding.  [place] Benign Endometrial Polyp excised 09/18/2017. Using condoms for contraception.  Currently abstinent.  Gono-chlam negative 07/2019.  No pelvic pain.  Vaginal discharge with itching at the vulva.  Breasts normal.  Urine and bowel movements normal.  BMI 27.4.  Improving fitness and nutrition.   Past medical history,surgical history, family history and social history were all reviewed and documented in the EPIC chart.  Gynecologic History [name] was 08/31/2019.  Obstetric History OB History  Gravida Para Term Preterm AB Living  0 0 0 0 0 0  SAB TAB Ectopic Multiple Live Births  0 0 0 0 0     ROS: A ROS was performed and pertinent positives and negatives are included in the history.  GENERAL: No fevers or chills. HEENT: No change in vision, no earache, sore throat or sinus congestion. NECK: No pain or stiffness. CARDIOVASCULAR: No chest pain or pressure. No palpitations. PULMONARY: No shortness of breath, cough or wheeze. GASTROINTESTINAL: No abdominal pain, nausea, vomiting or diarrhea, melena or bright red blood per rectum. GENITOURINARY: No urinary frequency, urgency, hesitancy or dysuria. MUSCULOSKELETAL: No joint or muscle pain, no back pain, no recent trauma. DERMATOLOGIC: No rash, no itching, no lesions. ENDOCRINE: No polyuria, polydipsia, no heat or cold intolerance. No recent change in weight. HEMATOLOGICAL: No anemia or easy bruising or bleeding. NEUROLOGIC: No headache, seizures, numbness, tingling or weakness. PSYCHIATRIC: No depression, no loss of interest in normal activity or change in sleep pattern.     Exam:   BP 118/80 (BP Location: Right Arm, Patient Position: Sitting, Cuff Size: Normal)    Ht 5\' 1"  (1.549 m)   Wt 145 lb (65.8 kg)   LMP 08/31/2019   BMI 27.40 kg/m   Body mass index is 27.4 kg/m.  General appearance : Well developed well nourished female. No acute distress HEENT: Eyes: no retinal hemorrhage or exudates,  Neck supple, trachea midline, no carotid bruits, no thyroidmegaly Lungs: Clear to auscultation, no rhonchi or wheezes, or rib retractions  Heart: Regular rate and rhythm, no murmurs or gallops Breast:Examined in sitting and supine position were symmetrical in appearance, no palpable masses or tenderness,  no skin retraction, no nipple inversion, no nipple discharge, no skin discoloration, no axillary or supraclavicular lymphadenopathy Abdomen: no palpable masses or tenderness, no rebound or guarding Extremities: no edema or skin discoloration or tenderness  Pelvic: Vulva: Normal             Vagina: No gross lesions.  Increased yeasty discharge  Cervix: No gross lesions or discharge.  Pap reflex done.  Uterus  AV, normal size, shape and consistency, non-tender and mobile  Adnexa  Without masses or tenderness  Anus: Normal   Assessment/Plan:  29 y.o. female for annual exam   1. Encounter for routine gynecological examination with Papanicolaou smear of cervix Normal gynecologic exam.  Pap reflex done.  Breast exam normal.  Body mass index 27.4.  Improving fitness and nutrition currently.  2. Use of condoms for contraception  3. Metrorrhagia History of endometrial polyp.  Recurrence of metrorrhagia.  Decision to proceed with a pelvic ultrasound to investigate. - US Transvaginal Non-OB; Future  4. Yeast vaginitis We will treat with fluconazole.  Usage reviewed  and prescription sent to pharmacy.  Precautions reviewed.  Other orders - fluconazole (DIFLUCAN) 150 MG tablet; Take 1 tablet (150 mg total) by mouth daily for 3 days.  [name] MD, 2:17 PM 09/22/2019

## 2019-09-24 ENCOUNTER — Encounter: Payer: Self-pay | Admitting: Obstetrics & Gynecology

## 2019-09-24 NOTE — Patient Instructions (Signed)
1. Encounter for routine gynecological examination with Papanicolaou smear of cervix Normal gynecologic exam.  Pap reflex done.  Breast exam normal.  Body mass index 27.4.  Improving fitness and nutrition currently.  2. Use of condoms for contraception  3. Metrorrhagia History of endometrial polyp.  Recurrence of metrorrhagia.  Decision to proceed with a pelvic ultrasound to investigate. - US Transvaginal Non-OB; Future  4. Yeast vaginitis We will treat with fluconazole.  Usage reviewed and prescription sent to pharmacy.  Precautions reviewed.  Other orders - fluconazole (DIFLUCAN) 150 MG tablet; Take 1 tablet (150 mg total) by mouth daily for 3 days.  Dana Manning, it was a pleasure seeing you today!  I will inform you of your results as soon as they are available.

## 2019-09-26 LAB — PAP IG W/ RFLX HPV ASCU

## 2019-09-28 ENCOUNTER — Encounter: Payer: 59 | Admitting: Rehabilitative and Restorative Service Providers"

## 2019-09-28 ENCOUNTER — Telehealth: Payer: Self-pay | Admitting: Rehabilitative and Restorative Service Providers"

## 2019-09-28 NOTE — Telephone Encounter (Signed)
Called Pt. After no show.  She indicated she forgot about appointment.  Currently scheduled on 23rd of march.   Scot Jun, PT, DPT, OCS, ATC 09/28/19  3:01 PM

## 2019-09-29 ENCOUNTER — Encounter: Payer: Self-pay | Admitting: *Deleted

## 2019-10-04 ENCOUNTER — Encounter: Payer: Self-pay | Admitting: Rehabilitative and Restorative Service Providers"

## 2019-10-04 ENCOUNTER — Other Ambulatory Visit: Payer: Self-pay

## 2019-10-04 ENCOUNTER — Ambulatory Visit (INDEPENDENT_AMBULATORY_CARE_PROVIDER_SITE_OTHER): Payer: 59 | Admitting: Rehabilitative and Restorative Service Providers"

## 2019-10-04 DIAGNOSIS — G8929 Other chronic pain: Secondary | ICD-10-CM | POA: Diagnosis not present

## 2019-10-04 DIAGNOSIS — M6281 Muscle weakness (generalized): Secondary | ICD-10-CM

## 2019-10-04 DIAGNOSIS — M25562 Pain in left knee: Secondary | ICD-10-CM | POA: Diagnosis not present

## 2019-10-04 DIAGNOSIS — M545 Low back pain, unspecified: Secondary | ICD-10-CM

## 2019-10-04 NOTE — Therapy (Signed)
[facility] [address] [place], [place], [address] Phone: [phone]   Fax:  [phone]  Physical Therapy Treatment/Progress Notes  Patient Details  Name: Dana Manning MRN: [id number] Date of Birth: [birth year] Referring Provider (PT): HIlts  Progress Note Reporting Period 08/05/2019 to 10/04/2019  See note below for Objective Data and Assessment of Progress/Goals.       Encounter Date: 10/04/2019  PT End of Session - 10/04/19 1502    Visit Number  6    Number of Visits  12    Date for PT Re-Evaluation  11/15/19    Authorization Type  [facility]    PT Start Time  1445    PT Stop Time  1526    PT Time Calculation (min)  41 min    Activity Tolerance  Patient tolerated treatment well    Behavior During Therapy  WFL for tasks assessed/performed       Past Medical History:  Diagnosis Date  . Anemia    09-07-2017 per pt mild  . Endometrial polyp   . Metrorrhagia     Past Surgical History:  Procedure Laterality Date  . APPENDECTOMY  2006  . DILATATION & CURETTAGE/HYSTEROSCOPY WITH MYOSURE N/A 06/20/2016   Procedure: DILATATION & CURETTAGE/HYSTEROSCOPY WITH MYOSURE;  Surgeon: Terrance Mass, MD;  Location: [facility];  Service: Gynecology;  Laterality: N/A;    . DILATATION & CURETTAGE/HYSTEROSCOPY WITH MYOSURE N/A 09/18/2017   Procedure: DILATATION & CURETTAGE/HYSTEROSCOPY WITH MYOSURE;  Surgeon: [name], MD;  Location: [facility];  Service: Gynecology;  Laterality: N/A;  requests 7:30am OR time Requests one hour  . TONSILLECTOMY  age 34    There were no vitals filed for this visit.  Subjective Assessment - 10/04/19 1443    Subjective  Pt. indicated feeling symptoms return over time since last visit.  Felt that missing last appointment was part of the regression.  Noticed complaints mainly c prolonged sitting.    Pertinent History  no significant PMH    Diagnostic tests  recent lumbar XR and his impression  was "no obvious spondylolisthesis    Patient Stated Goals  get rid of the pain, strengthen core    Currently in Pain?  Yes    Pain Location  Back    Pain Onset  1 to 4 weeks ago    Aggravating Factors   Update 10/04/2019: Patient Specific Functional Scale in ().    Sitting prolonged (6/10)         [facility] PT Assessment - 10/04/19 0001      AROM   Lumbar Flexion  to toes c no complaints    Lumbar Extension  100% WFL    Lumbar - Right Side Bend  to lateral knee jt    Lumbar - Left Side Bend  to lateral knee jt      Strength   Right Hip Flexion  5/5    Right Hip Extension  5/5    Right Hip ABduction  5/5    Left Hip Extension  5/5    Left Hip ABduction  4+/5      Palpation   Palpation comment  Tenderness and Trigger Points noted in L QL to touch                   [facility] Adult PT Treatment/Exercise - 10/04/19 0001      Lumbar Exercises: Stretches   Single Knee to Chest Stretch  5 reps   15 second bilateral  Lower Trunk Rotation  5 reps;Other (comment)   15 seconds bilateral     Lumbar Exercises: Aerobic   Nustep  L6 10 mins      Lumbar Exercises: Prone   Other Prone Lumbar Exercises  prone plank on toes 1 min      Electrical Stimulation   Electrical Stimulation Location  Lumbar    Electrical Stimulation Action  IFC c ther ex    Electrical Stimulation Parameters  to tolerance    Electrical Stimulation Goals  Pain      Manual Therapy   Manual therapy comments  STM, compression to Lt QL trigger point             PT Education - 10/04/19 1502    Education Details  Cues for breaking up sitting time to improve mobility.    Person(s) Educated  Patient    Methods  Explanation;Demonstration    Comprehension  Verbalized understanding;Verbal cues required          PT Long Term Goals - 10/04/19 1505      PT LONG TERM GOAL #1   Title  Pt will be I and compliant with HEP.    Time  6    Period  Weeks    Status  Achieved      PT LONG TERM GOAL #2    Title  Pt will reduce overall pain to less than 2-3/10 with usual activity, and have decreased knee pain upon standing after sitting for long time    Time  6    Period  Weeks    Status  Partially Met    Target Date  11/15/19      PT LONG TERM GOAL #3   Title  Pt will improve hip strength to 5/5 MMT all planes to improve stability and function    Time  6    Period  Weeks    Status  Achieved      PT LONG TERM GOAL #4   Title  Pt will improve core strength by holding a plank 60 seconds to improve overall function.    Time  6    Period  Weeks    Status  Achieved    Target Date  --      PT LONG TERM GOAL #5   Title  Pt. will report patient specific functional scale for sitting > 8/10 to facilitate improved sitting tolerance.    Time  6    Period  Weeks    Target Date  11/15/19            Plan - 10/04/19 1503    Clinical Impression Statement  Pt. has attended 6 visits overall during course of treatment.  See objective data for updated information.  Pt. has demonstrated improvements in symptoms presentation c initiation of HEP paired c in clinic manual intervention for myofascial mobility.  Continued mobility intervention paired c core stabilty/strengthening indicated at this time.    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    Rehab Potential  Good    PT Frequency  2x / week   1-2   PT Duration  6 weeks    PT Treatment/Interventions  ADLs/Self Care Home Management;Cryotherapy;Electrical Stimulation;Iontophoresis '4mg'$ /ml Dexamethasone;Moist Heat;Traction;Ultrasound;Neuromuscular re-education;Therapeutic exercise;Therapeutic activities;Patient/family education;Manual techniques;Dry needling;Passive range of motion;Taping;Spinal Manipulations;Joint Manipulations    PT Next Visit Plan  Continued DN prn, progress strength/movement  coordination control.  PT Home Exercise Plan  Access Code: [email or web address]    Consulted and Agree with Plan of Care  Patient       Patient will benefit from skilled therapeutic intervention in order to improve the following deficits and impairments:  Decreased activity tolerance, Decreased endurance, Decreased range of motion, Decreased strength, Increased fascial restricitons, Impaired flexibility, Pain  Visit Diagnosis: Chronic bilateral low back pain without sciatica  Acute pain of left knee  Muscle weakness (generalized)     Problem List Patient Active Problem List   Diagnosis Date Noted  . Intramural leiomyoma of uterus 05/14/2016  . Bloating symptom 09/21/2015  . Epigastric pain 09/21/2015  . Excessive and frequent menstruation with regular cycle 09/21/2015    Scot Jun, PT, DPT, OCS, ATC 10/04/19  3:27 PM    [facility] [address] [place], [place], [address] Phone: [phone]   Fax:  [phone]  Name: Myleen Brailsford MRN: [id number] Date of Birth: 18-Mar-1991

## 2019-10-12 ENCOUNTER — Other Ambulatory Visit: Payer: Self-pay

## 2019-10-13 ENCOUNTER — Ambulatory Visit: Payer: 59 | Admitting: Obstetrics & Gynecology

## 2019-10-13 ENCOUNTER — Other Ambulatory Visit: Payer: Self-pay

## 2019-10-13 ENCOUNTER — Encounter: Payer: Self-pay | Admitting: Obstetrics & Gynecology

## 2019-10-13 ENCOUNTER — Ambulatory Visit (INDEPENDENT_AMBULATORY_CARE_PROVIDER_SITE_OTHER): Payer: 59

## 2019-10-13 DIAGNOSIS — N921 Excessive and frequent menstruation with irregular cycle: Secondary | ICD-10-CM

## 2019-10-13 DIAGNOSIS — Z789 Other specified health status: Secondary | ICD-10-CM

## 2019-10-13 NOTE — Patient Instructions (Signed)
1. Metrorrhagia Mild occasional metrorrhagia.  History of endometrial polyp removed by a hysteroscopy March 2019.  Pelvic ultrasound findings reviewed thoroughly with patient today.  Uterus is normal in size and shape and the endometrial lining is symmetrical and thin at 5.04 mm.  No mass. thickening or abnormal blood flow seen at the endometrium.  Both ovaries are normal.  Patient reassured.  Will observe.  2. Use of condoms for contraception Declines alternative contraception.  Zafiro, it was a pleasure seeing you today!

## 2019-10-13 NOTE — Progress Notes (Signed)
    Dana Manning 08/23/1990 [id number]        28 y.o.  G0P0000   RP: Metrorrhagia for Pelvic US  HPI: Normal regular menses every month.  Occasional metrorrhagia.  Using Condoms.  No pelvic pain.  H/O Endometrial Polyp removed by [facility] 09/2017.   OB History  Gravida Para Term Preterm AB Living  0 0 0 0 0 0  SAB TAB Ectopic Multiple Live Births  0 0 0 0 0    Past medical history,surgical history, problem list, medications, allergies, family history and social history were all reviewed and documented in the EPIC chart.   Directed ROS with pertinent positives and negatives documented in the history of present illness/assessment and plan.  Exam:  There were no vitals filed for this visit. General appearance:  Normal  Pelvic US today: T/V images.  Anteverted uterus normal in size and shape with no myometrial mass.  The uterine size is measured at 8.72 x 4.96 x 4.15 cm.  The endometrial lining is symmetrical measured at 5.04 mm.  No mass or thickening or abnormal blood flow seen at the endometrium.  Both ovaries are normal in size with normal follicular pattern.  No adnexal mass.  No free fluid in the posterior cul-de-sac.   Assessment/Plan:  29 y.o. G0P0000   1. Metrorrhagia Mild occasional metrorrhagia.  History of endometrial polyp removed by a hysteroscopy March 2019.  Pelvic ultrasound findings reviewed thoroughly with patient today.  Uterus is normal in size and shape and the endometrial lining is symmetrical and thin at 5.04 mm.  No mass. thickening or abnormal blood flow seen at the endometrium.  Both ovaries are normal.  Patient reassured.  Will observe.  2. Use of condoms for contraception Declines alternative contraception.  [name] MD, 3:28 PM 10/13/2019

## 2019-10-21 ENCOUNTER — Encounter: Payer: Self-pay | Admitting: Rehabilitative and Restorative Service Providers"

## 2019-10-21 ENCOUNTER — Ambulatory Visit (INDEPENDENT_AMBULATORY_CARE_PROVIDER_SITE_OTHER): Payer: 59 | Admitting: Rehabilitative and Restorative Service Providers"

## 2019-10-21 ENCOUNTER — Other Ambulatory Visit: Payer: Self-pay

## 2019-10-21 DIAGNOSIS — M25562 Pain in left knee: Secondary | ICD-10-CM

## 2019-10-21 DIAGNOSIS — M6281 Muscle weakness (generalized): Secondary | ICD-10-CM | POA: Diagnosis not present

## 2019-10-21 DIAGNOSIS — M545 Low back pain: Secondary | ICD-10-CM

## 2019-10-21 DIAGNOSIS — G8929 Other chronic pain: Secondary | ICD-10-CM

## 2019-10-21 NOTE — Therapy (Signed)
[facility] [address] [place], [place], [address] Phone: [phone]   Fax:  [phone]  Physical Therapy Treatment  Patient Details  Name: Dana Manning MRN: [id number] Date of Birth: 07/09/1991 Referring Provider (PT): HIlts   Encounter Date: 10/21/2019  PT End of Session - 10/21/19 1045    Visit Number  7    Number of Visits  12    Date for PT Re-Evaluation  11/15/19    Authorization Type  [facility]    PT Start Time  1015    PT Stop Time  1105    PT Time Calculation (min)  50 min    Activity Tolerance  Patient tolerated treatment well    Behavior During Therapy  [facility] for tasks assessed/performed       Past Medical History:  Diagnosis Date  . Anemia    09-07-2017 per pt mild  . Endometrial polyp   . Metrorrhagia     Past Surgical History:  Procedure Laterality Date  . APPENDECTOMY  2006  . DILATATION & CURETTAGE/HYSTEROSCOPY WITH MYOSURE N/A 06/20/2016   Procedure: DILATATION & CURETTAGE/HYSTEROSCOPY WITH MYOSURE;  Surgeon: Terrance Mass, MD;  Location: [facility];  Service: Gynecology;  Laterality: N/A;    . DILATATION & CURETTAGE/HYSTEROSCOPY WITH MYOSURE N/A 09/18/2017   Procedure: DILATATION & CURETTAGE/HYSTEROSCOPY WITH MYOSURE;  Surgeon: [name], MD;  Location: [facility];  Service: Gynecology;  Laterality: N/A;  requests 7:30am OR time Requests one hour  . TONSILLECTOMY  age 29    There were no vitals filed for this visit.  Subjective Assessment - 10/21/19 1020    Subjective  Pt. rated pain 5/10 today.  Pt. indicated feeling pain was presistent and didn't get improvement from last visit.  HEP not change symptoms.    Pertinent History  no significant PMH    Diagnostic tests  recent lumbar XR and his impression was "no obvious spondylolisthesis    Patient Stated Goals  get rid of the pain, strengthen core    Currently in Pain?  Yes    Pain Score  5     Pain Location  Back    Pain Orientation   Left;Mid;Lower    Pain Descriptors / Indicators  Aching;Throbbing    Pain Type  Chronic pain    Pain Onset  1 to 4 weeks ago    Pain Frequency  Constant    Aggravating Factors   sitting prolonged, moving in bed    Pain Relieving Factors  Minimal changes c HEP in last week or so.                       [facility] Adult PT Treatment/Exercise - 10/21/19 0001      Lumbar Exercises: Stretches   Single Knee to Chest Stretch  5 reps;Left;Right   15 seconds   Lower Trunk Rotation  5 reps;Other (comment)   15 seconds x 5 bilateral     Lumbar Exercises: Supine   Dead Bug  20 reps;Other (comment)   bilateral c SLR   Single Leg Bridge  20 reps   bilateral     Lumbar Exercises: Quadruped   Opposite Arm/Leg Raise  Left arm/Right leg;Right arm/Left leg;10 reps      Acupuncturist Location  Lumbar    Electrical Stimulation Action  premod    Electrical Stimulation Parameters  to tolerance    Electrical Stimulation Goals  Pain  Manual Therapy   Manual therapy comments  active compression inferior paraspinals.       Trigger Point Dry Needling - 10/21/19 0001    Consent Given?  Yes    Education Handout Provided  Previously provided    Muscles Treated Back/Hip  Erector spinae    Dry Needling Comments  Strong twithc response           PT Education - 10/21/19 1043    Education Details  Review of HEP.  Education on movement coordination lacking of lumbar and hip movement.    Person(s) Educated  Patient    Methods  Explanation;Verbal cues    Comprehension  Verbalized understanding;Returned demonstration          PT Long Term Goals - 10/04/19 1505      PT LONG TERM GOAL #1   Title  Pt will be I and compliant with HEP.    Time  6    Period  Weeks    Status  Achieved      PT LONG TERM GOAL #2   Title  Pt will reduce overall pain to less than 2-3/10 with usual activity, and have decreased knee pain upon standing after sitting for  long time    Time  6    Period  Weeks    Status  Partially Met    Target Date  11/15/19      PT LONG TERM GOAL #3   Title  Pt will improve hip strength to 5/5 MMT all planes to improve stability and function    Time  6    Period  Weeks    Status  Achieved      PT LONG TERM GOAL #4   Title  Pt will improve core strength by holding a plank 60 seconds to improve overall function.    Time  6    Period  Weeks    Status  Achieved    Target Date  --      PT LONG TERM GOAL #5   Title  Pt. will report patient specific functional scale for sitting > 8/10 to facilitate improved sitting tolerance.    Time  6    Period  Weeks    Target Date  11/15/19            Plan - 10/21/19 1044    Clinical Impression Statement  Presentation today showed normal to hypermobility within lumbar spine c concordant pain at L4, L5 cPA assessment.  Reduced Lt QL tightness, palpation tenderness noted today c more concordant focal pains from inferior aspect of paraspinals near pelvic attachment.  Fair activation and control of TrA and pelvic floor musculature.    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    PT Home Exercise Plan  Access Code: [email or web address]   Consulted and Agree with Plan of Care  Patient        Patient will benefit from skilled therapeutic intervention in order to improve the  following deficits and impairments:  Decreased activity tolerance, Decreased endurance, Decreased range of motion, Decreased strength, Increased fascial restricitons, Impaired flexibility, Pain  Visit Diagnosis: Chronic bilateral low back pain without sciatica  Acute pain of left knee  Muscle weakness (generalized)     Problem List Patient Active Problem List   Diagnosis Date Noted  . Intramural leiomyoma of uterus 05/14/2016  . Bloating symptom 09/21/2015  . Epigastric pain 09/21/2015  . Excessive and frequent menstruation with regular cycle 09/21/2015    Scot Jun, PT, DPT, OCS, ATC 10/21/19  11:23 AM     [facility] Physical [facility] [address] [place], [place], [address] Phone: [phone]   Fax:  [phone]  Name: Dana Manning MRN: [id number] Date of Birth: 1990/11/21

## 2019-10-26 ENCOUNTER — Other Ambulatory Visit: Payer: Self-pay

## 2019-10-26 ENCOUNTER — Ambulatory Visit (INDEPENDENT_AMBULATORY_CARE_PROVIDER_SITE_OTHER): Payer: 59 | Admitting: Rehabilitative and Restorative Service Providers"

## 2019-10-26 DIAGNOSIS — M545 Low back pain: Secondary | ICD-10-CM | POA: Diagnosis not present

## 2019-10-26 DIAGNOSIS — M6281 Muscle weakness (generalized): Secondary | ICD-10-CM | POA: Diagnosis not present

## 2019-10-26 DIAGNOSIS — G8929 Other chronic pain: Secondary | ICD-10-CM

## 2019-10-26 DIAGNOSIS — M25562 Pain in left knee: Secondary | ICD-10-CM | POA: Diagnosis not present

## 2019-10-26 NOTE — Therapy (Signed)
[facility] [address] [place], [place], [address] Phone: [phone]   Fax:  [phone]  Physical Therapy Treatment  Patient Details  Name: Dana Manning MRN: [id number] Date of Birth: 04/12/1991 Referring Provider (PT): HIlts   Encounter Date: 10/26/2019  PT End of Session - 10/26/19 1415    Visit Number  8    Number of Visits  12    Date for PT Re-Evaluation  11/15/19    Authorization Type  [facility]    PT Start Time  1357    PT Stop Time  1500    PT Time Calculation (min)  63 min    Activity Tolerance  Other (comment)   Reactionary soreness noted substantially after dry needling.   Behavior During Therapy  WFL for tasks assessed/performed       Past Medical History:  Diagnosis Date  . Anemia    09-07-2017 per pt mild  . Endometrial polyp   . Metrorrhagia     Past Surgical History:  Procedure Laterality Date  . APPENDECTOMY  2006  . DILATATION & CURETTAGE/HYSTEROSCOPY WITH MYOSURE N/A 06/20/2016   Procedure: DILATATION & CURETTAGE/HYSTEROSCOPY WITH MYOSURE;  Surgeon: Juan H Fernandez, MD;  Location: [facility];  Service: Gynecology;  Laterality: N/A;    . DILATATION & CURETTAGE/HYSTEROSCOPY WITH MYOSURE N/A 09/18/2017   Procedure: DILATATION & CURETTAGE/HYSTEROSCOPY WITH MYOSURE;  Surgeon: Lavoie, Marie-Lyne, MD;  [facility]:  [facility];  Service: Gynecology;  Laterality: N/A;  requests 7:30am OR time Requests one hour  . TONSILLECTOMY  age 29    There were no vitals filed for this visit.  Subjective Assessment - 10/26/19 1358    Subjective  Pt. stated feeling some soreness after last visit and also have doing some dead lifts.  Pt. indicated 3/10 pain.    Pain Score  3     Pain Location  Back    Pain Orientation  Left;Mid;Lower    Pain Descriptors / Indicators  Aching;Throbbing    Pain Type  Chronic pain    Pain Onset  More than a month ago    Pain Frequency  Intermittent    Aggravating Factors   dead lift,  bending, running.                       [facility] Adult PT Treatment/Exercise - 10/26/19 0001      Lumbar Exercises: Stretches   Single Knee to Chest Stretch  5 reps;Left;Right    Other Lumbar Stretch Exercise  Rt sidelying regional rotation stretch 15 sec x 5      Lumbar Exercises: Aerobic   Nustep  lvl 6 10 mins      Lumbar Exercises: Standing   Other Standing Lumbar Exercises  DL RDL c bar to tolerance 2 x 10      Lumbar Exercises: Supine   Bridge  Compliant;20 reps      Moist Heat Therapy   Number Minutes Moist Heat  10 Minutes    Moist Heat Location  Lumbar Spine      Electrical Stimulation   Electrical Stimulation Location  Lumbar    Electrical Stimulation Action  premod    Electrical Stimulation Parameters  to tolerance c moist heat pad    Electrical Stimulation Goals  Pain      Manual Therapy   Manual therapy comments  active compression, STM to Lt inferior paraspinals, Rt sidelying regional rotation mob grade 4       Trigger Point Dry   Needling - 10/26/19 0001    Consent Given?  Yes    Muscles Treated Back/Hip  Erector spinae    Dry Needling Comments  Strong twithc response           PT Education - 10/26/19 1414    Education Details  Cues, dead lift procudure adjustments for symptom reduction.    Person(s) Educated  Patient    Methods  Explanation;Demonstration;Verbal cues    Comprehension  Verbalized understanding;Returned demonstration          PT Long Term Goals - 10/04/19 1505      PT LONG TERM GOAL #1   Title  Pt will be I and compliant with HEP.    Time  6    Period  Weeks    Status  Achieved      PT LONG TERM GOAL #2   Title  Pt will reduce overall pain to less than 2-3/10 with usual activity, and have decreased knee pain upon standing after sitting for long time    Time  6    Period  Weeks    Status  Partially Met    Target Date  11/15/19      PT LONG TERM GOAL #3   Title  Pt will improve hip strength to 5/5 MMT all  planes to improve stability and function    Time  6    Period  Weeks    Status  Achieved      PT LONG TERM GOAL #4   Title  Pt will improve core strength by holding a plank 60 seconds to improve overall function.    Time  6    Period  Weeks    Status  Achieved    Target Date  --      PT LONG TERM GOAL #5   Title  Pt. will report patient specific functional scale for sitting > 8/10 to facilitate improved sitting tolerance.    Time  6    Period  Weeks    Target Date  11/15/19            Plan - 10/26/19 1414    Clinical Impression Statement  Strong local twitch response noted in Lt inferior erector spinae.  Reduced palpation tenderness and trigger points noted in area as compared to last visit but still present.  Continued hip/core strengthening indicated to offload lumbar posterior musculature, improve control.  Pt. exprienced increased myofascial soreness in area after treatment.  Instructions for stretching in HEP, continued movement and heating/ice prn for symptom relief in next 24 hours.    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    PT Home Exercise Plan  Access Code: [email or web address]   Consulted and Agree with Plan of Care  Patient       Patient will benefit from skilled therapeutic intervention in order to improve the following deficits and  impairments:  Decreased activity tolerance, Decreased endurance, Decreased range of motion, Decreased strength, Increased fascial restricitons, Impaired flexibility, Pain  Visit Diagnosis: Chronic bilateral  low back pain without sciatica  Acute pain of left knee  Muscle weakness (generalized)     Problem List Patient Active Problem List   Diagnosis Date Noted  . Intramural leiomyoma of uterus 05/14/2016  . Bloating symptom 09/21/2015  . Epigastric pain 09/21/2015  . Excessive and frequent menstruation with regular cycle 09/21/2015    Scot Jun, PT, DPT, OCS, ATC 10/26/19  3:14 PM    [facility] [address] [place], [place], [address] Phone: [phone]   Fax:  [phone]  Name: Dana Manning MRN: [id number] Date of Birth: 1990-09-25

## 2019-10-28 ENCOUNTER — Telehealth: Payer: Self-pay | Admitting: Rehabilitative and Restorative Service Providers"

## 2019-10-28 ENCOUNTER — Encounter: Payer: 59 | Admitting: Rehabilitative and Restorative Service Providers"

## 2019-10-28 NOTE — Telephone Encounter (Signed)
Called and left patient a voice message about today's no show for appointment as well as reminder for next appointment scheduled for next week.  Scot Jun, PT, DPT, OCS, ATC 10/28/19  11:12 AM

## 2019-11-02 ENCOUNTER — Encounter: Payer: 59 | Admitting: Rehabilitative and Restorative Service Providers"

## 2019-11-09 ENCOUNTER — Encounter: Payer: Self-pay | Admitting: Physical Therapy

## 2019-11-09 ENCOUNTER — Other Ambulatory Visit: Payer: Self-pay

## 2019-11-09 ENCOUNTER — Ambulatory Visit (INDEPENDENT_AMBULATORY_CARE_PROVIDER_SITE_OTHER): Payer: 59 | Admitting: Physical Therapy

## 2019-11-09 DIAGNOSIS — M6281 Muscle weakness (generalized): Secondary | ICD-10-CM | POA: Diagnosis not present

## 2019-11-09 DIAGNOSIS — G8929 Other chronic pain: Secondary | ICD-10-CM

## 2019-11-09 DIAGNOSIS — M25562 Pain in left knee: Secondary | ICD-10-CM | POA: Diagnosis not present

## 2019-11-09 DIAGNOSIS — M545 Low back pain: Secondary | ICD-10-CM | POA: Diagnosis not present

## 2019-11-09 NOTE — Therapy (Signed)
[facility] [address] [place], [place], [address] Phone: [phone]   Fax:  [phone]  Physical Therapy Treatment  Patient Details  Name: Dana Manning MRN: [id number] Date of Birth: [birth year] Referring Provider (PT): HIlts   Encounter Date: 11/09/2019  PT End of Session - 11/09/19 1630    Visit Number  9    Number of Visits  12    Date for PT Re-Evaluation  11/15/19    Authorization Type  [facility]    PT Start Time  5631   pt arrived late   PT Stop Time  1615    PT Time Calculation (min)  32 min    Activity Tolerance  Other (comment)   Reactionary soreness noted substantially after dry needling.   Behavior During Therapy  [facility] for tasks assessed/performed       Past Medical History:  Diagnosis Date  . Anemia    09-07-2017 per pt mild  . Endometrial polyp   . Metrorrhagia     Past Surgical History:  Procedure Laterality Date  . APPENDECTOMY  2006  . DILATATION & CURETTAGE/HYSTEROSCOPY WITH MYOSURE N/A 06/20/2016   Procedure: DILATATION & CURETTAGE/HYSTEROSCOPY WITH MYOSURE;  Surgeon: Terrance Mass, MD;  Location: [facility];  Service: Gynecology;  Laterality: N/A;    . DILATATION & CURETTAGE/HYSTEROSCOPY WITH MYOSURE N/A 09/18/2017   Procedure: DILATATION & CURETTAGE/HYSTEROSCOPY WITH MYOSURE;  Surgeon: [name], MD;  Location: [facility];  Service: Gynecology;  Laterality: N/A;  requests 7:30am OR time Requests one hour  . TONSILLECTOMY  age 29    There were no vitals filed for this visit.  Subjective Assessment - 11/09/19 1544    Subjective  had a lot of pain this morning rated an 8/10 on Lt low back.  may have been the way she was sleeping.  took a meloxicam and pain is now resolved.    Diagnostic tests  recent lumbar XR and his impression was "no obvious spondylolisthesis    Patient Stated Goals  get rid of the pain, strengthen core    Currently in Pain?  No/denies    Pain Onset  More than a  month ago                       [facility] Adult PT Treatment/Exercise - 11/09/19 1546      Self-Care   Self-Care  Other Self-Care Comments    Other Self-Care Comments   discussed core exercises that she performs with personal trainer and discussed modifications including no more than 1 foot off mat, keeping feet closer to body as working and posture/technique - pt verbalized understanding.  also discussed current POC and progress - pt feels PT has been somewhat beneficial with DN providing the most benefit but with limited carryover.  Also discussed that true strength changes take consistent work and consistent attendance with PT is essential to progress.  Pt verbalized understanding, and is due for recert next week.      Lumbar Exercises: Aerobic   Recumbent Bike  L3 x 5 min      Lumbar Exercises: Supine   Other Supine Lumbar Exercises  mini crunch x 20 and mini crunch with twist x 20; mod cues for form      Manual Therapy   Soft tissue mobilization  Lt paraspinals; gentle grade 1 L4/5 CPA mobs       Trigger Point Dry Needling - 11/09/19 1629    Consent Given?  Yes  Education Handout Provided  Previously provided    Muscles Treated Back/Hip  Lumbar multifidi;Erector spinae    Lumbar multifidi Response  Twitch response elicited    Quadratus Lumborum Response  Twitch response elicited           PT Education - 11/09/19 1630    Education Details  see self care    Person(s) Educated  Patient    Methods  Explanation    Comprehension  Verbalized understanding          PT Long Term Goals - 10/04/19 1505      PT LONG TERM GOAL #1   Title  Pt will be I and compliant with HEP.    Time  6    Period  Weeks    Status  Achieved      PT LONG TERM GOAL #2   Title  Pt will reduce overall pain to less than 2-3/10 with usual activity, and have decreased knee pain upon standing after sitting for long time    Time  6    Period  Weeks    Status  Partially Met     Target Date  11/15/19      PT LONG TERM GOAL #3   Title  Pt will improve hip strength to 5/5 MMT all planes to improve stability and function    Time  6    Period  Weeks    Status  Achieved      PT LONG TERM GOAL #4   Title  Pt will improve core strength by holding a plank 60 seconds to improve overall function.    Time  6    Period  Weeks    Status  Achieved    Target Date  --      PT LONG TERM GOAL #5   Title  Pt. will report patient specific functional scale for sitting > 8/10 to facilitate improved sitting tolerance.    Time  6    Period  Weeks    Target Date  11/15/19            Plan - 11/09/19 1630    Clinical Impression Statement  Long discussion with pt about current progress and PT interventions today.  Plan to discuss next week about continuing PT v/s return to MD based on what pt would prefer.  Pt has felt some improvements mostly with DN with PT, but at this time carryover is minimal.  Will plan to further discuss at next visit.    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 need recert v/s d/c depending on how she feels    PT Home Exercise Plan  Access Code: [email or web address]    Consulted and Agree with Plan of Care  Patient       Patient will benefit from skilled therapeutic intervention in order to improve the following deficits and  impairments:  Decreased activity tolerance, Decreased endurance, Decreased range of motion, Decreased strength, Increased fascial restricitons, Impaired flexibility, Pain  Visit Diagnosis: Chronic bilateral low back pain  without sciatica  Acute pain of left knee  Muscle weakness (generalized)     Problem List Patient Active Problem List   Diagnosis Date Noted  . Intramural leiomyoma of uterus 05/14/2016  . Bloating symptom 09/21/2015  . Epigastric pain 09/21/2015  . Excessive and frequent menstruation with regular cycle 09/21/2015      Laureen Abrahams, PT, DPT 11/09/19 4:33 PM     [facility] [address] [place], [place], [address] Phone: [phone]   Fax:  [phone]  Name: Barby Colvard MRN: [id number] Date of Birth: 24-Sep-1990

## 2019-11-15 ENCOUNTER — Ambulatory Visit (INDEPENDENT_AMBULATORY_CARE_PROVIDER_SITE_OTHER): Payer: 59 | Admitting: Rehabilitative and Restorative Service Providers"

## 2019-11-15 ENCOUNTER — Encounter: Payer: Self-pay | Admitting: Rehabilitative and Restorative Service Providers"

## 2019-11-15 ENCOUNTER — Other Ambulatory Visit: Payer: Self-pay

## 2019-11-15 DIAGNOSIS — M545 Low back pain, unspecified: Secondary | ICD-10-CM

## 2019-11-15 DIAGNOSIS — M6281 Muscle weakness (generalized): Secondary | ICD-10-CM

## 2019-11-15 DIAGNOSIS — G8929 Other chronic pain: Secondary | ICD-10-CM

## 2019-11-15 DIAGNOSIS — M25562 Pain in left knee: Secondary | ICD-10-CM | POA: Diagnosis not present

## 2019-11-15 NOTE — Therapy (Addendum)
[facility] [address] [place], [place], [address] Phone: [phone]   Fax:  [phone]  Physical Therapy Treatment/Discharge  Patient Details  Name: Dana Manning MRN: [id number] Date of Birth: 13-Jul-1991 Referring Provider (PT): HIlts  Progress Note Reporting Period 10/04/2019 to 11/15/2019  See note below for Objective Data and Assessment of Progress/Goals.      Encounter Date: 11/15/2019  PT End of Session - 11/15/19 1350    Visit Number  10    Number of Visits  14    Date for PT Re-Evaluation  12/13/19    Authorization Type  [facility]    PT Start Time  1345    PT Stop Time  1448    PT Time Calculation (min)  63 min    Activity Tolerance  Other (comment)   Reactionary soreness noted substantially after dry needling.   Behavior During Therapy  [facility] for tasks assessed/performed       Past Medical History:  Diagnosis Date  . Anemia    09-07-2017 per pt mild  . Endometrial polyp   . Metrorrhagia     Past Surgical History:  Procedure Laterality Date  . APPENDECTOMY  2006  . DILATATION & CURETTAGE/HYSTEROSCOPY WITH MYOSURE N/A 06/20/2016   Procedure: DILATATION & CURETTAGE/HYSTEROSCOPY WITH MYOSURE;  Surgeon: Terrance Mass, MD;  Location: [facility];  Service: Gynecology;  Laterality: N/A;    . DILATATION & CURETTAGE/HYSTEROSCOPY WITH MYOSURE N/A 09/18/2017   Procedure: DILATATION & CURETTAGE/HYSTEROSCOPY WITH MYOSURE;  Surgeon: [name], MD;  Location: [facility];  Service: Gynecology;  Laterality: N/A;  requests 7:30am OR time Requests one hour  . TONSILLECTOMY  age 29    There were no vitals filed for this visit.  Subjective Assessment - 11/15/19 1348    Subjective  Pt. indicated no real pain complaints indicated since last visit.  Pt. indicated doing some of the adjustments to workout activity to help reduce pain.  No pain medicine indicated since last visit.    Diagnostic tests  recent lumbar XR and  his impression was "no obvious spondylolisthesis    Patient Stated Goals  get rid of the pain, strengthen core    Currently in Pain?  No/denies    Pain Score  0-No pain    Pain Location  Back    Pain Orientation  Left;Right;Lower    Pain Descriptors / Indicators  Aching;Throbbing    Pain Type  Chronic pain    Pain Onset  More than a month ago    Pain Frequency  Intermittent    Aggravating Factors   running, bending, sleeping         [facility] PT Assessment - 11/15/19 0001      Assessment   Medical Diagnosis  Chronic LBP, Lt knee pain    Referring Provider (PT)  HIlts    Next MD Visit  PRN      Restrictions   Weight Bearing Restrictions  No      Prior Function   Level of Independence  Independent    Vocation  Full time employment    Vocation Requirements  mostly sitting work for [employer]    Leisure  works out with trainier 2-3 times per week      AROM   Lumbar Flexion  to toes c no complaints    Lumbar Extension  100% WFL    Lumbar - Right Side Bend  to head of fibula    Lumbar - Left Side Bend  to head fibula      Strength   Right Hip Flexion  5/5    Right Hip Extension  5/5    Right Hip ABduction  5/5    Left Hip Flexion  5/5    Left Hip Extension  5/5    Left Hip ABduction  4+/5      Palpation   Palpation comment  TrP c concordant pain from inferior paraspinals bilateral                   [facility] Adult PT Treatment/Exercise - 11/15/19 0001      Lumbar Exercises: Stretches   Single Knee to Chest Stretch  5 reps;Left;Right   15 seconds   Lower Trunk Rotation  5 reps;Other (comment)   15 seconds x 5     Lumbar Exercises: Aerobic   Nustep  lvl 6 10 mins      Lumbar Exercises: Supine   Bridge  Compliant;10 reps    Other Supine Lumbar Exercises  supine pball press c UE/LE flexion 10 sec x 10      Lumbar Exercises: Quadruped   Opposite Arm/Leg Raise  Left arm/Right leg;Right arm/Left leg;15 reps;3 seconds      Electrical Stimulation   Electrical  Stimulation Location  Lumbar    Electrical Stimulation Action  IFC    Electrical Stimulation Parameters  to tolerance    Electrical Stimulation Goals  Pain      Manual Therapy   Soft tissue mobilization  Rt paraspinals STM       Trigger Point Dry Needling - 11/15/19 0001    Consent Given?  Yes    Education Handout Provided  Previously provided    Muscles Treated Back/Hip  Lumbar multifidi;Erector spinae   Rt L3-L4   Lumbar multifidi Response  Twitch response elicited    Quadratus Lumborum Response  Twitch response elicited                PT Long Term Goals - 11/15/19 1415      PT LONG TERM GOAL #1   Title  Pt will be I and compliant with HEP.    Baseline  self admission inconsistent at times - updated 11/15/2019    Time  4    Period  Weeks    Status  Partially Met    Target Date  12/13/19      PT LONG TERM GOAL #2   Title  Pt will reduce overall pain to less than 2-3/10 with usual activity, and have decreased knee pain upon standing after sitting for long time    Time  4    Period  Weeks    Status  Partially Met    Target Date  12/13/19      PT LONG TERM GOAL #3   Title  Pt will improve hip strength to 5/5 MMT all planes to improve stability and function    Time  4    Period  Weeks    Status  Partially Met    Target Date  12/13/19      PT LONG TERM GOAL #4   Title  Pt will improve core strength by holding a plank 60 seconds to improve overall function.    Period  Weeks    Status  Achieved      PT LONG TERM GOAL #5   Title  Pt. will report patient specific functional scale for sitting > 8/10 to facilitate improved sitting tolerance.    Baseline  sitting prolonged 8/10, workout activity at PLOF 5/10     : updated 11/15/2019    Time  4    Period  Weeks    Status  On-going    Target Date  12/13/19            Plan - 11/15/19 1412    Clinical Impression Statement  Pt. continued to present c variable severity/irritability of low back pain c primarily  movement coordination restriction at this time.  See objective data for updated information for current presentation.  Concordant symptoms noted at this time from myofascial restriction lumbar paraspinals, multifidi and QL at times.  Pt. may continue to benefit from improve lumbar and hip movement coordination improvements as well as manual intervention for myofascial restrictions prn.  After discussion c Pt., agreed POC to continue 1x/week for next 4 weeks and then reassess for possible d/c or return to MD based off symptom progression.    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    PT Home Exercise Plan  Access Code: [email or web address]    Consulted and Agree with Plan of Care  Patient       Patient will benefit from skilled therapeutic intervention in order to improve the following deficits and impairments:  Decreased activity tolerance, Decreased endurance, Decreased range of motion, Decreased strength, Increased fascial restricitons, Impaired flexibility, Pain  Visit Diagnosis: Chronic bilateral low back pain without sciatica - Plan: PT plan of care cert/re-cert  Acute pain of left knee - Plan: PT plan of care cert/re-cert  Muscle weakness (generalized) - Plan: PT plan of care cert/re-cert     Problem List Patient Active  Problem List   Diagnosis Date Noted  . Intramural leiomyoma of uterus 05/14/2016  . Bloating symptom 09/21/2015  . Epigastric pain 09/21/2015  . Excessive and frequent menstruation with regular cycle 09/21/2015    Scot Jun, PT, DPT, OCS, ATC 11/15/19  2:39 PM  PHYSICAL THERAPY DISCHARGE SUMMARY  Visits from Start of Care: 10  Current functional level related to goals / functional outcomes: See note  Remaining deficits: See note   Education / Equipment: See note Plan: Patient agrees to discharge.  Patient goals were partially met. Patient is being discharged due to not returning since the last visit.  ?????    Scot Jun, PT, DPT, OCS, ATC 02/14/20  12:08 PM     [facility] [address] [place], [place], [address] Phone: [phone]   Fax:  [phone]  Name: Arhianna Ebey MRN: [id number] Date of Birth: 06/08/1991

## 2019-12-20 ENCOUNTER — Ambulatory Visit (INDEPENDENT_AMBULATORY_CARE_PROVIDER_SITE_OTHER)
Admission: RE | Admit: 2019-12-20 | Discharge: 2019-12-20 | Disposition: A | Discharge status: Home / Self Care | Payer: 59 | Source: Ambulatory Visit

## 2019-12-20 DIAGNOSIS — N898 Other specified noninflammatory disorders of vagina: Secondary | ICD-10-CM

## 2019-12-20 MED ORDER — FLUCONAZOLE 200 MG PO TABS: ORAL_TABLET | ORAL | 0 refills | Status: DC

## 2019-12-20 NOTE — Discharge Instructions (Addendum)
Symptoms consistent with yeast infection Prescribed diflucan 200 mg once daily and then second dose 72 hours later Follow up with PCP/ OB as needed Follow up in person or go to ER if you have any new or worsening symptoms fever, chills, nausea, vomiting, abdominal or pelvic pain, painful intercourse, vaginal discharge, vaginal bleeding, persistent symptoms despite treatment, etc..Marland Kitchen

## 2019-12-20 NOTE — ED Provider Notes (Signed)
[place] Virtual Visit via Video Note:  Dana Manning  initiated request for Telemedicine visit with [facility] team. I connected with Dana Manning  on 12/20/2019 at 11:14 AM  for a synchronized telemedicine visit using a video enabled HIPPA compliant telemedicine application. I verified that I am speaking with Dana Manning  using two identifiers. Dana Manning, Dana Manning  was physically located in a [facility] Urgent care site and Dana Manning was located at a different location.   The limitations of evaluation and management by telemedicine as well as the availability of in-person appointments were discussed. Patient was informed that she  may incur a bill ( including co-pay) for this virtual visit encounter. Dana Manning  expressed understanding and gave verbal consent to proceed with virtual visit.  [id number] 12/20/19 Arrival Time: 1101   SW:NIOEVOJ DISCHARGE  SUBJECTIVE:  Dana Manning is a 29 y.o. female who presents with complaints of vaginal itching and discharge x 1 week.  She denies a precipitating event, recent sexual encounter or recent antibiotic use.  Patient is sexually active, but no recent sexual activity.  Describes discharge as thick and creamy, "cottage cheese" consistency.  She has NOT tried OTC medications.  Denies aggravating factors.  She reports similar symptoms in the past and was diagnosed with BV and yeast.  She denies fever, chills, nausea, vomiting, abdominal or pelvic pain, urinary symptoms, vaginal odor, vaginal bleeding, dyspareunia, vaginal rashes or lesions.   LMP: 12/05/19  ROS: As per HPI.  All other pertinent ROS negative.     Past Medical History:  Diagnosis Date  . Anemia    09-07-2017 per pt mild  . Endometrial polyp   . Metrorrhagia    Past Surgical History:  Procedure Laterality Date  . APPENDECTOMY  2006  . DILATATION & CURETTAGE/HYSTEROSCOPY WITH MYOSURE N/A 06/20/2016   Procedure: DILATATION &  CURETTAGE/HYSTEROSCOPY WITH MYOSURE;  Surgeon: Terrance Mass, MD;  Location: [facility];  Service: Gynecology;  Laterality: N/A;    . DILATATION & CURETTAGE/HYSTEROSCOPY WITH MYOSURE N/A 09/18/2017   Procedure: DILATATION & CURETTAGE/HYSTEROSCOPY WITH MYOSURE;  Surgeon: [name], MD;  Location: [place];  Service: Gynecology;  Laterality: N/A;  requests 7:30am OR time Requests one hour  . TONSILLECTOMY  age 17   No Known Allergies No current facility-administered medications on file prior to encounter.   No current outpatient medications on file prior to encounter.     OBJECTIVE:  There were no vitals filed for this visit.  General appearance: alert; no distress Eyes: EOMI grossly HENT: normocephalic; atraumatic Neck: supple with FROM Lungs: normal respiratory effort; speaking in full sentences without difficulty Extremities: moves extremities without difficulty Skin: No obvious rashes Neurologic: No facial asymmetries Psychological: alert and cooperative; normal mood and affect  ASSESSMENT & PLAN:  1. Vaginal discharge   2. Vaginal itching     Meds ordered this encounter  Medications  . fluconazole (DIFLUCAN) 200 MG tablet    Sig: Take one dose by mouth, wait 72 hours, and then take second dose by mouth    Dispense:  2 tablet    Refill:  0    Order Specific Question:   Supervising Provider    Answer:   Raylene Everts [[id number]]   Symptoms consistent with yeast infection Prescribed diflucan 200 mg once daily and then second dose 72 hours later Follow up with PCP/ OB as needed Follow up in person or go to ER if you have any new or  worsening symptoms fever, chills, nausea, vomiting, abdominal or pelvic pain, painful intercourse, vaginal discharge, vaginal bleeding, persistent symptoms despite treatment, etc...  I discussed the assessment and treatment plan with the patient. The patient was provided an opportunity to ask questions and all were  answered. The patient agreed with the plan and demonstrated an understanding of the instructions.   The patient was advised to call back or seek an in-person evaluation if the symptoms worsen or if the condition fails to improve as anticipated.  I provided 13 minutes of non-face-to-face time during this encounter.  [place], Dana Manning  12/20/2019 11:14 AM       Dana Manning, Dana Manning 12/20/19 1115

## 2020-02-23 ENCOUNTER — Telehealth: Payer: 59 | Admitting: Emergency Medicine

## 2020-02-23 DIAGNOSIS — N760 Acute vaginitis: Secondary | ICD-10-CM | POA: Diagnosis not present

## 2020-02-23 NOTE — Progress Notes (Signed)
We are sorry that you are not feeling well. Here is how we plan to help! Based on what you shared with me it looks like you: May have a vaginosis due to bacteria and yeast.  Vaginosis is an inflammation of the vagina that can result in discharge, itching and pain. The cause is usually a change in the normal balance of vaginal bacteria or an infection. Vaginosis can also result from reduced estrogen levels after menopause.  The most common causes of vaginosis are:   Bacterial vaginosis which results from an overgrowth of one on several organisms that are normally present in your vagina.   Yeast infections which are caused by a naturally occurring fungus called candida.   Vaginal atrophy (atrophic vaginosis) which results from the thinning of the vagina from reduced estrogen levels after menopause.   Trichomoniasis which is caused by a parasite and is commonly transmitted by sexual intercourse.  Factors that increase your risk of developing vaginosis include: Marland Kitchen Medications, such as antibiotics and steroids . Uncontrolled diabetes . Use of hygiene products such as bubble bath, vaginal spray or vaginal deodorant . Douching . Wearing damp or tight-fitting clothing . Using an intrauterine device (IUD) for birth control . Hormonal changes, such as those associated with pregnancy, birth control pills or menopause . Sexual activity . Having a sexually transmitted infection  Your treatment plan is A single Diflucan (fluconazole) 150mg  tablet once.  I have electronically sent this prescription into the pharmacy that you have chosen.   I have also prescribed Metronidazole (Flagyl) 500 mg twice daily for 7 days. Do not alcohol while taking these medications.  Be sure to take all of the medication as directed. Stop taking any medication if you develop a rash, tongue swelling or shortness of breath. Mothers who are breast feeding should consider pumping and discarding their breast milk while on these  antibiotics. However, there is no consensus that infant exposure at these doses would be harmful.  Remember that medication creams can weaken latex condoms. .  GET HELP RIGHT AWAY IF:  . You have pain in your lower abdomen ( pelvic area or over your ovaries) . You develop nausea or vomiting . You develop a fever . Your discharge changes or worsens or does not improve . You have persistent pain with intercourse . You develop shortness of breath, a rapid pulse, or you faint.  These symptoms could be signs of problems or infections that need to be evaluated by a medical provider now.    HOME CARE:  Good hygiene may prevent some types of vaginosis from recurring and may relieve some symptoms:  . Avoid baths, hot tubs and whirlpool spas. Rinse soap from your outer genital area after a shower, and dry the area well to prevent irritation. Don't use scented or harsh soaps, such as those with deodorant or antibacterial action. Marland Kitchen Avoid irritants. These include scented tampons and pads. . Wipe from front to back after using the toilet. Doing so avoids spreading fecal bacteria to your vagina.  Other things that may help prevent vaginosis include:  Marland Kitchen Don't douche. Your vagina doesn't require cleansing other than normal bathing. Repetitive douching disrupts the normal organisms that reside in the vagina and can actually increase your risk of vaginal infection. Douching won't clear up a vaginal infection. . Use a latex condom. Both female and female latex condoms may help you avoid infections spread by sexual contact. . Wear cotton underwear. Also wear pantyhose with a cotton crotch. If  you feel comfortable without it, skip wearing underwear to bed. Yeast thrives in [employer] Your symptoms should improve in the next day or two.   MAKE SURE YOU    Understand these instructions.  Will watch your condition.  Will get help right away if you are not doing well or get worse.  Your e-visit  answers were reviewed by a board certified advanced clinical practitioner to complete your personal care plan. Depending upon the condition, your plan could have included both over the counter or prescription medications. Please review your pharmacy choice to make sure that you have choses a pharmacy that is open for you to pick up any needed prescription, Your safety is important to [place]. If you have drug allergies check your prescription carefully.   You can use MyChart to ask questions about today's visit, request a non-urgent call back, or ask for a work or school excuse for 24 hours related to this e-Visit. If it has been greater than 24 hours you will need to follow up with your provider, or enter a new e-Visit to address those concerns. You will get a MyChart message within the next two days asking about your experience. I hope that your e-visit has been valuable and will speed your recovery.  **Please do not respond to this message unless you have follow up questions.** Greater than 5 but less than 10 minutes spent researching, coordinating, and implementing care for this patient today

## 2020-02-27 MED ORDER — METRONIDAZOLE 500 MG PO TABS
500.0000 mg | ORAL_TABLET | Freq: Two times a day (BID) | ORAL | 0 refills | Status: AC
Start: 2020-02-27 — End: 2020-03-05

## 2020-02-27 MED ORDER — FLUCONAZOLE 150 MG PO TABS
150.0000 mg | ORAL_TABLET | Freq: Once | ORAL | 0 refills | Status: AC
Start: 2020-02-27 — End: 2020-02-27

## 2020-02-27 NOTE — Addendum Note (Signed)
Addended by: Kinnie Feil on: 02/27/2020 11:52 AM   Modules accepted: Orders

## 2020-05-09 ENCOUNTER — Ambulatory Visit (INDEPENDENT_AMBULATORY_CARE_PROVIDER_SITE_OTHER): Payer: 59 | Admitting: Family Medicine

## 2020-05-09 ENCOUNTER — Encounter: Payer: Self-pay | Admitting: Family Medicine

## 2020-05-09 ENCOUNTER — Ambulatory Visit: Payer: 59 | Admitting: Family Medicine

## 2020-05-09 ENCOUNTER — Ambulatory Visit: Payer: Self-pay

## 2020-05-09 ENCOUNTER — Other Ambulatory Visit: Payer: Self-pay

## 2020-05-09 DIAGNOSIS — R202 Paresthesia of skin: Secondary | ICD-10-CM | POA: Diagnosis not present

## 2020-05-09 DIAGNOSIS — M79642 Pain in left hand: Secondary | ICD-10-CM | POA: Diagnosis not present

## 2020-05-09 DIAGNOSIS — R200 Anesthesia of skin: Secondary | ICD-10-CM | POA: Diagnosis not present

## 2020-05-09 NOTE — Progress Notes (Signed)
Office Visit Note   Patient: Dana Manning           Date of Birth: 1991-02-17           MRN: [id number] Visit Date: 05/09/2020 Requested by: Kristopher Glee., MD [address] [place],  [place] [address] PCP: Kristopher Glee., MD  Subjective: Chief Complaint  Patient presents with  . 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 Pain starts in around the thumb and up into the wrist. Throbbing pain. Numbness at night. Has tried 2 different splints & ACE wrap - no help.    HPI: 29yo F presenting to clinic with concerns of left hand pain, numbness since 2018 (4 years). Patient states that she has tried numerous braces, none of which offered significant improvement. She says she works with her hands throughout the day, working on the computer, and she will notice worsening pain when she does this. She has tried researching her symptoms, as was concerned about DQT. She says that this prompted her to buy a thumb splint, which offered no significant improvement. She has also tried ace bandages with minimal results. She is also concerned about numbness in the forearm and extending into her fingers. She says this has been ongoing for the same timeframe. Picture is also complicated by lower cervical pain, which she states she feels like she 'always needs to rub out.' She denies any trauma at the onset of these symptoms. Denies overt weakness in the hand. States she is otherwise healthy.               ROS:   All other systems were reviewed and are negative.  Objective: Vital Signs: There were no vitals taken for this visit.  Physical Exam:  General:  Alert and oriented, in no acute distress. Pulm:  Breathing unlabored. Psy:  Normal mood, congruent affect. Skin:  Left hand with no bruising, no rashes, no erythema. Overlying skin intact.   Left wrist:  No obvious swelling or deformity.  Tenderness to palpation over  the first dorsal wrist compartment, which is worsened with finklestein.  Tinel's causes tingling sensation in 3rd finger and thumb.  Phalen's with tingling in thumb Charlyn Minerva also reproduces tingling along forearm. Reduced strength with finger abduction (C8) when compared to right. Reduced grip strength (T1).  Fingers with brisk capillary refill, and sensation intact to light touch throughout.   Strength testing:  5 out of 5 strength with shoulder abduction (C5), wrist extension (C6), wrist flexion (C7).       Imaging: US Guided Needle Placement  Result Date: 05/09/2020 Limited diagnostic ultrasound of left wrist reveals minimal thickening of the tendon sheath at the first dorsal compartment with no hyperemia on power doppler imaging.  Virtually identical to asymptomatic right wrist.   Assessment & Plan: 29yo F presenting to clinic with concerns of several years of Left wrist pain, hand/forearm numbness. Examination as above, with positive Finklestein, positive Tinel's, positive Phalen, and complicated by positive Charlyn Minerva.  - POCUS performed, with minimal thickening in first dorsal compartment - Given multitude of positive findings on examination, will order EMG to help determine radiculopathy vs peripheral lesion as a source of her symptoms - Patient is agreeable with plan, she has no further questions or concerns today.      Procedures: No procedures performed  No notes on file     PMFS History:  Patient Active Problem List   Diagnosis Date Noted  . Intramural leiomyoma of uterus 05/14/2016  . Bloating symptom 09/21/2015  . Epigastric pain 09/21/2015  . Excessive and frequent menstruation with regular cycle 09/21/2015   Past Medical History:  Diagnosis Date  . Anemia    09-07-2017 per pt mild  . Endometrial polyp   . Metrorrhagia     Family History  Problem Relation Age of Onset  . Cancer Maternal Grandmother        SMALL INTESTINE-   . Cancer Maternal  Grandfather        PROSTATE- LYMPHOMA    Past Surgical History:  Procedure Laterality Date  . APPENDECTOMY  2006  . DILATATION & CURETTAGE/HYSTEROSCOPY WITH MYOSURE N/A 06/20/2016   Procedure: DILATATION & CURETTAGE/HYSTEROSCOPY WITH MYOSURE;  Surgeon: Terrance Mass, MD;  Location: [facility];  Service: Gynecology;  Laterality: N/A;    . DILATATION & CURETTAGE/HYSTEROSCOPY WITH MYOSURE N/A 09/18/2017   Procedure: DILATATION & CURETTAGE/HYSTEROSCOPY WITH MYOSURE;  Surgeon: [name], MD;  Location: [place];  Service: Gynecology;  Laterality: N/A;  requests 7:30am OR time Requests one hour  . TONSILLECTOMY  age 69   Social History   Occupational History  . Not on file  Tobacco Use  . Smoking status: Never Smoker  . Smokeless tobacco: Never Used  Vaping Use  . Vaping Use: Never used  Substance and Sexual Activity  . Alcohol use: No  . Drug use: No  . Sexual activity: Yes    Partners: Male    Birth control/protection: Condom    Comment: IST INTERCOURSE- 33, PARTNERS - 6, current partner- 2 months

## 2020-05-09 NOTE — Progress Notes (Signed)
I saw and examined the patient with Dr. Elouise Munroe and agree with assessment and plan as outlined.    Left wrist pain and hand numbness intermittently since 2018.  Also neck pain.    Exam suggestive of CTS and mild deQuervain's (ultrasound unremarkable), but also has positive Spurling's.    Will proceed with nerve studies.  CTS injection if indicated by results, vs. Cervical MRI.

## 2020-05-11 ENCOUNTER — Encounter: Payer: Self-pay | Admitting: Family Medicine

## 2020-05-31 ENCOUNTER — Encounter: Payer: Self-pay | Admitting: Obstetrics & Gynecology

## 2020-05-31 ENCOUNTER — Ambulatory Visit: Payer: 59 | Admitting: Obstetrics & Gynecology

## 2020-05-31 ENCOUNTER — Other Ambulatory Visit: Payer: Self-pay

## 2020-05-31 VITALS — BP 122/70

## 2020-05-31 DIAGNOSIS — N926 Irregular menstruation, unspecified: Secondary | ICD-10-CM

## 2020-05-31 DIAGNOSIS — Z7251 High risk heterosexual behavior: Secondary | ICD-10-CM | POA: Diagnosis not present

## 2020-05-31 LAB — PREGNANCY, URINE: Preg Test, Ur: NEGATIVE

## 2020-05-31 NOTE — Progress Notes (Signed)
    Dana Manning 06-24-1991 [id number]        29 y.o.  G0P0000 Stable boyfriend  RP: Irregular menses x end of October  HPI: Menses very regular every month with normal flow x 4-5 days with no BTB.  Had a period 05/09/2020 which started with lighter bleeding than usual on day #1, then no bleeding on day #2 and finally normal flow x 4-5 days.  Then bled again yesterday with bright red flow.  Not bleeding today.  No pelvic pain.  Normal vaginal secretions.  Declines STI screen.  H/O Endometrial Polyp removed by [facility] in 2019.  Last IC 04/28/2020, no condom used, no contraception.  Not sexually active since then.   OB History  Gravida Para Term Preterm AB Living  0 0 0 0 0 0  SAB TAB Ectopic Multiple Live Births  0 0 0 0 0    Past medical history,surgical history, problem list, medications, allergies, family history and social history were all reviewed and documented in the EPIC chart.   Directed ROS with pertinent positives and negatives documented in the history of present illness/assessment and plan.  Exam:  Vitals:   05/31/20 1056  BP: 122/70   General appearance:  Normal  Abdomen: Normal  Gynecologic exam: Vulva normal.  Speculum:  Cervix normal, vagina normal.  No Polyp at EO.  Normal secretions.  UPT Negative    Assessment/Plan:  29 y.o. G0  1. Irregular menses Irregular menses since last menstrual period May 09, 2020.  Had irregular flow at the beginning of that menstrual period.  Then had breakthrough bleeding with bright red blood yesterday.  Urine pregnancy test negative today with last unprotected intercourse April 28, 2020.  Will rule out thyroid dysfunction and hyperprolactinemia with a TSH and prolactin drawn today.  Pelvic exam was normal today and will further investigate with a pelvic ultrasound at follow-up.  Patient voiced understanding and agreement with the plan. - TSH - Prolactin - Pregnancy, urine - US Transvaginal Non-OB; Future  2.  Unprotected sexual intercourse Urine pregnancy test negative.  Last unprotected intercourse was April 28, 2020. - Pregnancy, urine  Other orders - meloxicam (MOBIC) 7.5 MG tablet; Take 7.5 mg by mouth daily.  [name] MD, 11:28 AM 05/31/2020

## 2020-06-01 LAB — TSH: TSH: 1.94 mIU/L

## 2020-06-01 LAB — PROLACTIN: Prolactin: 33.2 ng/mL — ABNORMAL HIGH

## 2020-06-19 ENCOUNTER — Encounter: Payer: Self-pay | Admitting: Physical Medicine and Rehabilitation

## 2020-06-19 ENCOUNTER — Ambulatory Visit (INDEPENDENT_AMBULATORY_CARE_PROVIDER_SITE_OTHER): Payer: 59 | Admitting: Physical Medicine and Rehabilitation

## 2020-06-19 ENCOUNTER — Other Ambulatory Visit: Payer: Self-pay

## 2020-06-19 DIAGNOSIS — R202 Paresthesia of skin: Secondary | ICD-10-CM

## 2020-06-19 NOTE — Progress Notes (Signed)
  Numeric Pain Rating Scale and Functional Assessment Average Pain 3   In the last MONTH (on 0-10 scale) has pain interfered with the following?  1. General activity like being  able to carry out your everyday physical activities such as walking, climbing stairs, carrying groceries, or moving a chair?  Rating(1)

## 2020-06-20 ENCOUNTER — Ambulatory Visit: Payer: 59 | Admitting: Obstetrics & Gynecology

## 2020-06-20 ENCOUNTER — Encounter: Payer: Self-pay | Admitting: Obstetrics & Gynecology

## 2020-06-20 ENCOUNTER — Ambulatory Visit (INDEPENDENT_AMBULATORY_CARE_PROVIDER_SITE_OTHER): Payer: 59

## 2020-06-20 VITALS — BP 130/80

## 2020-06-20 DIAGNOSIS — N926 Irregular menstruation, unspecified: Secondary | ICD-10-CM

## 2020-06-20 DIAGNOSIS — E221 Hyperprolactinemia: Secondary | ICD-10-CM

## 2020-06-20 DIAGNOSIS — D251 Intramural leiomyoma of uterus: Secondary | ICD-10-CM | POA: Diagnosis not present

## 2020-06-20 DIAGNOSIS — N854 Malposition of uterus: Secondary | ICD-10-CM

## 2020-06-20 DIAGNOSIS — R9389 Abnormal findings on diagnostic imaging of other specified body structures: Secondary | ICD-10-CM

## 2020-06-20 NOTE — Progress Notes (Signed)
    Dana Manning June 09, 1991 [id number]        29 y.o.  G0P0000  Stable boyfriend  RP: Irregular menses for Pelvic US  HPI: Continued irregular bleeding, bleeding mildly currently.  On 05/31/2020 we noted: Menses very regular every month with normal flow x 4-5 days with no BTB.  Had a period 05/09/2020 which started with lighter bleeding than usual on day #1, then no bleeding on day #2 and finally normal flow x 4-5 days.  Then bled again yesterday with bright red flow.  Not bleeding today.  No pelvic pain.  Normal vaginal secretions.  Declines STI screen.  H/O Endometrial Polyp removed by [facility] in 2019.  Last IC 04/28/2020, no condom used, no contraception.  Not sexually active since then.    OB History  Gravida Para Term Preterm AB Living  0 0 0 0 0 0  SAB TAB Ectopic Multiple Live Births  0 0 0 0 0    Past medical history,surgical history, problem list, medications, allergies, family history and social history were all reviewed and documented in the EPIC chart.   Directed ROS with pertinent positives and negatives documented in the history of present illness/assessment and plan.  Exam:  Vitals:   06/20/20 1047  BP: 130/80   General appearance:  Normal  Pelvic US today: T/V images.  Anteverted uterus normal in size and shape with an anterior intramural fibroid measured at 1 cm.  The overall uterine size is measured at 8.17 x 5.76 x 4.58 cm.  The endometrial lining is symmetrical with echogenicities but no feeder vessels seen, cannot rule out clot versus endometrial polyp.  The endometrial lining is measured at 6.89 mm.  Both ovaries are normal in size with normal follicular pattern.  No adnexal mass.  No free fluid in the posterior cul-de-sac.  Prl 05/31/2020 was 33.2 TSH 05/31/2020 was normal   Assessment/Plan:  29 y.o. G0  1. Irregular menses Pelvic ultrasound findings thoroughly reviewed with patient.  Normal uterine size with a very small intramural fibroid measured at 1  cm.  The endometrial lining shows echogenicities with no feeder vessel, could be blood clots versus endometrial polyps.  The endometrial lining is measured at 6.89 mm.  Both ovaries are normal.  Decision to observe and repeat a pelvic ultrasound in 3 months away from a menstrual period.   - US Transvaginal Non-OB; Future in 3 months  2. Hyperprolactinemia (HCC) Borderline increase in prolactin last visit, will recheck today. - Prolactin  [name] MD, 11:19 AM 06/20/2020

## 2020-06-21 LAB — PROLACTIN: Prolactin: 12.5 ng/mL

## 2020-06-21 NOTE — Telephone Encounter (Signed)
Her prolactin level 3 weeks ago was 33.5 and now it is in normal range. What to advise patient?

## 2020-06-25 ENCOUNTER — Encounter: Payer: Self-pay | Admitting: Physical Medicine and Rehabilitation

## 2020-06-25 ENCOUNTER — Encounter: Payer: Self-pay | Admitting: Obstetrics & Gynecology

## 2020-06-25 NOTE — Progress Notes (Signed)
Dana Manning - 29 y.o. female MRN [id number]  Date of birth: [birth year]  Office Visit Note: Visit Date: 06/19/2020 PCP: Kristopher Glee., MD Referred by: Kristopher Glee., MD  Subjective: Chief Complaint  Patient presents with  . Left Hand - Numbness   HPI:  Dana Manning is a 29 y.o. female who comes in today at the request of Dr. Eunice Blase for electrodiagnostic study of the Left upper extremities.  Patient is Right hand dominant.  She reports 3 out of 10 pain but with significant pain numbness and tingling in the left arm and hand.  She reports this in the second third and fourth digits.  She denies any symptoms on the right.  No prior electrodiagnostic studies.  No history of diabetes.  No real frank radicular symptoms.  She does endorse some neck pain.   ROS Otherwise per HPI.  Assessment & Plan: Visit Diagnoses:    ICD-10-CM   1. Paresthesia of skin  R20.2 NCV with EMG (electromyography)    Plan: No additional findings. Impression: Essentially NORMAL electrodiagnostic study of the left upper limb.  There is no significant electrodiagnostic evidence of nerve entrapment, brachial plexopathy or cervical radiculopathy.  As you know, purely sensory or demyelinating radiculopathies and chemical radiculitis may not be detected with this particular electrodiagnostic study.  Recommendations: 1.  Follow-up with referring physician. 2.  Continue current management of symptoms.  Meds & Orders: No orders of the defined types were placed in this encounter.   Orders Placed This Encounter  Procedures  . NCV with EMG (electromyography)    Follow-up: Return Eunice Blase, MD.   Procedures: No procedures performed  EMG & NCV Findings: All nerve conduction studies (as indicated in the following tables) were within normal limits.    All examined muscles (as indicated in the following table) showed no evidence of electrical instability.    Impression: Essentially NORMAL  electrodiagnostic study of the left upper limb.  There is no significant electrodiagnostic evidence of nerve entrapment, brachial plexopathy or cervical radiculopathy.  As you know, purely sensory or demyelinating radiculopathies and chemical radiculitis may not be detected with this particular electrodiagnostic study.  Recommendations: 1.  Follow-up with referring physician. 2.  Continue current management of symptoms.  ___________________________ Laurence Spates FAAPMR Board Certified, American Board of Physical Medicine and Rehabilitation    Nerve Conduction Studies Anti Sensory Summary Table   Stim Site NR Peak (ms) Norm Peak (ms) P-T Amp (V) Norm P-T Amp Site1 Site2 Delta-P (ms) Dist (cm) Vel (m/s) Norm Vel (m/s)  Left Median Acr Palm Anti Sensory (2nd Digit)  30.7C  Wrist    2.8 <3.6 17.1 >10 Wrist Palm 1.4 0.0    Palm    1.4 <2.0 57.7         Left Radial Anti Sensory (Base 1st Digit)  31.4C  Wrist    2.0 <3.1 36.6  Wrist Base 1st Digit 2.0 0.0    Left Ulnar Anti Sensory (5th Digit)  31.2C  Wrist    2.8 <3.7 31.7 >15.0 Wrist 5th Digit 2.8 14.0 50 >38   Motor Summary Table   Stim Site NR Onset (ms) Norm Onset (ms) O-P Amp (mV) Norm O-P Amp Site1 Site2 Delta-0 (ms) Dist (cm) Vel (m/s) Norm Vel (m/s)  Left Median Motor (Abd Poll Brev)  31.7C  Wrist    2.9 <4.2 5.8 >5 Elbow Wrist 3.2 17.5 55 >50  Elbow    6.1  5.7  Left Ulnar Motor (Abd Dig Min)  32C  Wrist    2.3 <4.2 10.3 >3 B Elbow Wrist 3.0 17.5 58 >53  B Elbow    5.3  10.3  A Elbow B Elbow 1.1 10.0 91 >53  A Elbow    6.4  9.5          EMG   Side Muscle Nerve Root Ins Act Fibs Psw Amp Dur Poly Recrt Int Fraser Din Comment  Left Abd Poll Brev Median C8-T1 Nml Nml Nml Nml Nml 0 Nml Nml   Left 1stDorInt Ulnar C8-T1 Nml Nml Nml Nml Nml 0 Nml Nml   Left PronatorTeres Median C6-7 Nml Nml Nml Nml Nml 0 Nml Nml   Left Biceps Musculocut C5-6 Nml Nml Nml Nml Nml 0 Nml Nml   Left Deltoid Axillary C5-6 Nml Nml Nml Nml Nml 0 Nml  Nml     Nerve Conduction Studies Anti Sensory Left/Right Comparison   Stim Site L Lat (ms) R Lat (ms) L-R Lat (ms) L Amp (V) R Amp (V) L-R Amp (%) Site1 Site2 L Vel (m/s) R Vel (m/s) L-R Vel (m/s)  Median Acr Palm Anti Sensory (2nd Digit)  30.7C  Wrist 2.8   17.1   Wrist Palm     Palm 1.4   57.7         Radial Anti Sensory (Base 1st Digit)  31.4C  Wrist 2.0   36.6   Wrist Base 1st Digit     Ulnar Anti Sensory (5th Digit)  31.2C  Wrist 2.8   31.7   Wrist 5th Digit 50     Motor Left/Right Comparison   Stim Site L Lat (ms) R Lat (ms) L-R Lat (ms) L Amp (mV) R Amp (mV) L-R Amp (%) Site1 Site2 L Vel (m/s) R Vel (m/s) L-R Vel (m/s)  Median Motor (Abd Poll Brev)  31.7C  Wrist 2.9   5.8   Elbow Wrist 55    Elbow 6.1   5.7         Ulnar Motor (Abd Dig Min)  32C  Wrist 2.3   10.3   B Elbow Wrist 58    B Elbow 5.3   10.3   A Elbow B Elbow 91    A Elbow 6.4   9.5            Waveforms:             Clinical History: No specialty comments available.     Objective:  VS:  HT:    WT:   BMI:     BP:   HR: bpm  TEMP: ( )  RESP:  Physical Exam Musculoskeletal:        General: No swelling, tenderness or deformity.     Comments: Inspection reveals no atrophy of the bilateral APB or FDI or hand intrinsics. There is no swelling, color changes, allodynia or dystrophic changes. There is 5 out of 5 strength in the bilateral wrist extension, finger abduction and long finger flexion. There is intact sensation to light touch in all dermatomal and peripheral nerve distributions. There is a negative Hoffmann's test bilaterally.  Skin:    General: Skin is warm and dry.     Findings: No erythema or rash.  Neurological:     General: No focal deficit present.     Mental Status: She is alert and oriented to person, place, and time.     Motor: No weakness or abnormal muscle tone.  Coordination: Coordination normal.  Psychiatric:        Mood and Affect: Mood normal.        Behavior:  Behavior normal.      Imaging: No results found.

## 2020-06-26 NOTE — Procedures (Signed)
EMG & NCV Findings: All nerve conduction studies (as indicated in the following tables) were within normal limits.    All examined muscles (as indicated in the following table) showed no evidence of electrical instability.    Impression: Essentially NORMAL electrodiagnostic study of the left upper limb.  There is no significant electrodiagnostic evidence of nerve entrapment, brachial plexopathy or cervical radiculopathy.  As you know, purely sensory or demyelinating radiculopathies and chemical radiculitis may not be detected with this particular electrodiagnostic study.  Recommendations: 1.  Follow-up with referring physician. 2.  Continue current management of symptoms.  ___________________________ Laurence Spates FAAPMR Board Certified, American Board of Physical Medicine and Rehabilitation    Nerve Conduction Studies Anti Sensory Summary Table   Stim Site NR Peak (ms) Norm Peak (ms) P-T Amp (V) Norm P-T Amp Site1 Site2 Delta-P (ms) Dist (cm) Vel (m/s) Norm Vel (m/s)  Left Median Acr Palm Anti Sensory (2nd Digit)  30.7C  Wrist    2.8 <3.6 17.1 >10 Wrist Palm 1.4 0.0    Palm    1.4 <2.0 57.7         Left Radial Anti Sensory (Base 1st Digit)  31.4C  Wrist    2.0 <3.1 36.6  Wrist Base 1st Digit 2.0 0.0    Left Ulnar Anti Sensory (5th Digit)  31.2C  Wrist    2.8 <3.7 31.7 >15.0 Wrist 5th Digit 2.8 14.0 50 >38   Motor Summary Table   Stim Site NR Onset (ms) Norm Onset (ms) O-P Amp (mV) Norm O-P Amp Site1 Site2 Delta-0 (ms) Dist (cm) Vel (m/s) Norm Vel (m/s)  Left Median Motor (Abd Poll Brev)  31.7C  Wrist    2.9 <4.2 5.8 >5 Elbow Wrist 3.2 17.5 55 >50  Elbow    6.1  5.7         Left Ulnar Motor (Abd Dig Min)  32C  Wrist    2.3 <4.2 10.3 >3 B Elbow Wrist 3.0 17.5 58 >53  B Elbow    5.3  10.3  A Elbow B Elbow 1.1 10.0 91 >53  A Elbow    6.4  9.5          EMG   Side Muscle Nerve Root Ins Act Fibs Psw Amp Dur Poly Recrt Int Fraser Din Comment  Left Abd Poll Brev Median C8-T1 Nml Nml  Nml Nml Nml 0 Nml Nml   Left 1stDorInt Ulnar C8-T1 Nml Nml Nml Nml Nml 0 Nml Nml   Left PronatorTeres Median C6-7 Nml Nml Nml Nml Nml 0 Nml Nml   Left Biceps Musculocut C5-6 Nml Nml Nml Nml Nml 0 Nml Nml   Left Deltoid Axillary C5-6 Nml Nml Nml Nml Nml 0 Nml Nml     Nerve Conduction Studies Anti Sensory Left/Right Comparison   Stim Site L Lat (ms) R Lat (ms) L-R Lat (ms) L Amp (V) R Amp (V) L-R Amp (%) Site1 Site2 L Vel (m/s) R Vel (m/s) L-R Vel (m/s)  Median Acr Palm Anti Sensory (2nd Digit)  30.7C  Wrist 2.8   17.1   Wrist Palm     Palm 1.4   57.7         Radial Anti Sensory (Base 1st Digit)  31.4C  Wrist 2.0   36.6   Wrist Base 1st Digit     Ulnar Anti Sensory (5th Digit)  31.2C  Wrist 2.8   31.7   Wrist 5th Digit 50     Motor Left/Right Comparison   Stim  Site L Lat (ms) R Lat (ms) L-R Lat (ms) L Amp (mV) R Amp (mV) L-R Amp (%) Site1 Site2 L Vel (m/s) R Vel (m/s) L-R Vel (m/s)  Median Motor (Abd Poll Brev)  31.7C  Wrist 2.9   5.8   Elbow Wrist 55    Elbow 6.1   5.7         Ulnar Motor (Abd Dig Min)  32C  Wrist 2.3   10.3   B Elbow Wrist 58    B Elbow 5.3   10.3   A Elbow B Elbow 91    A Elbow 6.4   9.5            Waveforms:

## 2020-08-14 ENCOUNTER — Ambulatory Visit (INDEPENDENT_AMBULATORY_CARE_PROVIDER_SITE_OTHER): Payer: No Typology Code available for payment source | Admitting: Clinical

## 2020-08-14 ENCOUNTER — Other Ambulatory Visit: Payer: Self-pay

## 2020-08-14 DIAGNOSIS — F4321 Adjustment disorder with depressed mood: Secondary | ICD-10-CM | POA: Diagnosis not present
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
## 2020-08-21 ENCOUNTER — Ambulatory Visit (HOSPITAL_COMMUNITY): Payer: No Typology Code available for payment source | Admitting: Clinical

## 2020-08-22 ENCOUNTER — Ambulatory Visit (INDEPENDENT_AMBULATORY_CARE_PROVIDER_SITE_OTHER): Payer: No Typology Code available for payment source | Admitting: Clinical

## 2020-08-22 ENCOUNTER — Other Ambulatory Visit: Payer: Self-pay

## 2020-08-22 DIAGNOSIS — F4323 Adjustment disorder with mixed anxiety and depressed mood: Secondary | ICD-10-CM

## 2020-08-22 NOTE — Progress Notes (Signed)
   THERAPIST PROGRESS NOTE  Session Time: 2pm  Participation Level: Active  Behavioral Response: CasualAlertpleasant  Type of Therapy: Individual Therapy  Treatment Goals addressed: Anxiety  Interventions: CBT  Location-[facility]  Summary: Harini Dearmond is a 30 y.o. female who presents in pleasant mood. Pt discussed her challenges with managing her anxiety. Pt reports thinking worst case scenario and in doing so the effect it has on her daily functioning. Additonally, pt discussed having difficulty telling others no. Pt states she is fearful of what may occur if she was to tell partner or coworkers no. Pt acknowledges that committing to things she does not want to do increases her stress and anxiety. Pt participated in completion of treatment plan.  Suicidal/Homicidal: Pt denies SI/HI, no plan or intent reported.  Therapist Response: CSW assessed for changes in daily functioning mood and behavior. CSW discussed with pt self care tips and provided her with information on the positive steps to wellbeing. Active listening provided as pt discussed exercising regularly and incorporating jump rope into her regimen, as this is an activity she enjoys doing.  Plan: Return again in 1 weeks.  Diagnosis: Axis I: adjustment disorder with mixed anxiety and depressed mood    Axis II: No diagnosis    Yvette Rack, LCSW 08/22/2020

## 2020-08-28 ENCOUNTER — Other Ambulatory Visit: Payer: Self-pay

## 2020-08-28 ENCOUNTER — Ambulatory Visit (INDEPENDENT_AMBULATORY_CARE_PROVIDER_SITE_OTHER): Payer: No Typology Code available for payment source | Admitting: Clinical

## 2020-08-28 DIAGNOSIS — F4323 Adjustment disorder with mixed anxiety and depressed mood: Secondary | ICD-10-CM

## 2020-08-28 NOTE — Progress Notes (Signed)
   THERAPIST PROGRESS NOTE  Session Time: 1pm  Participation Level: Active  Behavioral Response: Casual and Well GroomedAlertAnxious  Type of Therapy: Individual Therapy  Treatment Goals addressed: Anxiety  Interventions: CBT  Location-[facility]  Summary: Pleasant Britz is a 30 y.o. female who presents in a tearful and anxious manner today.  Pt reports she discovered information in partners phone questioning the nature of their relationship. Pt spoke openly about the trust issues she has developed from previous relationships. Additionally, pt says her greatest concern is partner will become upset when he learns how she received the information and want to end the relationship. Pt discussed the dynamics of the relationship and further adjustments that will need to be made if they decide to move in together.  Suicidal/Homicidal:Pt denies SI/HI, no plan or intent reported.  Therapist Response: CSW assessed for changes in daily functioning, mood and behavior. CSW actively listened and validated pt feelings as she discussed the dynamics of relationship with partner. CSW assisted pt in completing exercise to assess level of anxiety and identify what could happen vs what will happen. CSW reviewed and provided pt with information on challenging automatic negative thinking. Pt encouraged to complete assignment prior to next session.  Plan: Return again in 1 weeks.  Diagnosis: Axis I: Adjustment disorder with mixed anxiety and depressed mood    Axis II: No diagnosis    Yvette Rack, LCSW 08/28/2020

## 2020-09-06 ENCOUNTER — Other Ambulatory Visit: Payer: Self-pay

## 2020-09-06 ENCOUNTER — Ambulatory Visit (INDEPENDENT_AMBULATORY_CARE_PROVIDER_SITE_OTHER): Payer: No Typology Code available for payment source | Admitting: Clinical

## 2020-09-06 DIAGNOSIS — F4323 Adjustment disorder with mixed anxiety and depressed mood: Secondary | ICD-10-CM

## 2020-09-06 NOTE — Progress Notes (Signed)
   THERAPIST PROGRESS NOTE  Session Time: 9am  Participation Level: Active  Behavioral Response: CasualAlertAnxious  Type of Therapy: Individual Therapy  Treatment Goals addressed: Anxiety  Interventions: CBT  [facility]  Summary: Dana Manning is a 30 y.o. female who presents with anxiety today. Pt reports her partner has requested that they take a separation since finding out that she looked at his cell phone. Pt says she is fearful that partner will want to terminate the relationship and is now questioning whether or not he can trust her. Pt discussed having abandonment issues that she says stems from her father leaving during her childhood. Per pt report, she has the tendency to assume something bad will occur if a relationship was to end. Pt also says she feels alone and that she does not want to burden family members or friends with her stressors. Pt spoke openly about seeking validation and reassurance from her partner and feeling inadequate if she does not receive it. Pt states having a prayer journal that she uses to express her feelings but says since the separation has been afraid to write down her feelings, due to being afraid the worst case scenario will happen. Pt presented with difficulty understanding the cycle of anxiety and how to challenge negative thinking. Pt asked questions about antianxiety medication but is concerned she will have the same negative experiences as people who have taken the medication.  Suicidal/Homicidal:Pt denies SI/HI no plan or intent to harm self or others reported.  Therapist Response: CSW assessed for changes in mood and behavior. CSW discussed with pt the cycle of anxiety. CSW provided pt with information on socratic questions to challenge negative thinking. CSW discussed with pt potential benefits of participating in support group; pt states "that would be weird" CSW actively listened as the pt discussed having crying spell at work, stemming  from separation from boyfriend. Processed with pt using I messages to improve communication and express feelings with interacting with partner. CSW informed pt medication referral can be further discussed during next session due to her having reservation at this time.  Plan: Return again in 1 weeks.  Diagnosis: Axis I: Adjustment disorder with mixed anxiety and depressed mood    Axis II: No diagnosis    Yvette Rack, LCSW 09/06/2020

## 2020-09-13 ENCOUNTER — Other Ambulatory Visit: Payer: Self-pay

## 2020-09-13 ENCOUNTER — Ambulatory Visit (INDEPENDENT_AMBULATORY_CARE_PROVIDER_SITE_OTHER): Payer: No Typology Code available for payment source | Admitting: Obstetrics & Gynecology

## 2020-09-13 ENCOUNTER — Encounter: Payer: Self-pay | Admitting: Obstetrics & Gynecology

## 2020-09-13 ENCOUNTER — Ambulatory Visit (INDEPENDENT_AMBULATORY_CARE_PROVIDER_SITE_OTHER): Payer: No Typology Code available for payment source

## 2020-09-13 VITALS — BP 120/78

## 2020-09-13 DIAGNOSIS — R9389 Abnormal findings on diagnostic imaging of other specified body structures: Secondary | ICD-10-CM

## 2020-09-13 DIAGNOSIS — N926 Irregular menstruation, unspecified: Secondary | ICD-10-CM | POA: Diagnosis not present

## 2020-09-13 DIAGNOSIS — N921 Excessive and frequent menstruation with irregular cycle: Secondary | ICD-10-CM

## 2020-09-13 NOTE — Progress Notes (Signed)
    Dana Manning 03/07/1991 [id number]        29 y.o.  G0  RP: Metrorrhagia for Pelvic US  HPI: Metrorrhagia with h/o excision of an endometrial Polyp in 2019.  Last 2 cycles were normal without BTB, but some cramping.     OB History  Gravida Para Term Preterm AB Living  0 0 0 0 0 0  SAB IAB Ectopic Multiple Live Births  0 0 0 0 0    Past medical history,surgical history, problem list, medications, allergies, family history and social history were all reviewed and documented in the EPIC chart.   Directed ROS with pertinent positives and negatives documented in the history of present illness/assessment and plan.  Exam:  Vitals:   09/13/20 1018  BP: 120/78   General appearance:  Normal  Pelvic US today: T/V images.  Anteverted uterus normal in size and shape measured at 8.92 x 5.66 x 4.67 cm.  Anterior fibroid measured at 1 cm subserosally.  Thickened endometrial lining on day 26 of the cycle measured at 17.96 mm.  Both ovaries are normal in size with normal follicular pattern.  Left ovary with an avascular thick walled collapsed corpus luteum measured at 1.5 cm.  No adnexal mass.  Mild clear free fluid seen bilaterally at the adnexa and in the cul-de-sac.   Assessment/Plan:  30 y.o. G0  1. Metrorrhagia Metrorrhagia with a thickened endometrium, but on day #26 of her cycle. H/O Endometrial Polyp.  Will f/u for Sonohysto for further investigation. - [place] Sonohysterogram; Future  2. Thickened endometrium As above, endometrial line at 17.96 mm.  R/O endometrial polyp or other pathologies. - [place] Sonohysterogram; Future  Dana Bruins MD, 10:49 AM 09/13/2020

## 2020-09-14 ENCOUNTER — Ambulatory Visit (INDEPENDENT_AMBULATORY_CARE_PROVIDER_SITE_OTHER): Payer: No Typology Code available for payment source | Admitting: Clinical

## 2020-09-14 DIAGNOSIS — F4323 Adjustment disorder with mixed anxiety and depressed mood: Secondary | ICD-10-CM

## 2020-09-14 NOTE — Progress Notes (Signed)
   THERAPIST PROGRESS NOTE  Session Time: 9am  Participation Level: Active  Behavioral Response: Casual, Sad  Type of Therapy: Individual  Treatment Goals addressed:Anxiety  Interventions:Supportive, CBT Virtual Visit via Video Note  I connected with Dana Manning on 09/14/20 at  9:00 AM EST by a video enabled telemedicine application and verified that I am speaking with the correct person using two identifiers.  Location: Patient: home Provider: office   I discussed the limitations of evaluation and management by telemedicine and the availability of in person appointments. The patient expressed understanding and agreed to proceed.   I discussed the assessment and treatment plan with the patient. The patient was provided an opportunity to ask questions and all were answered. The patient agreed with the plan and demonstrated an understanding of the instructions.   The patient was advised to call back or seek an in-person evaluation if the symptoms worsen or if the condition fails to improve as anticipated.  I provided 45 minutes of non-face-to-face time during this encounter.  Summary: Dana Manning is a 30 y.o. female who reports recent breakup with her partner. Pt reports she has been using retail therapy as a way to fill this void. Pt discussed having unstable body image and having difficulty receiving compliments. Per pt report, she plans to begin exercising with a friend next week and getting a personal trainer soon. Pt demonstrates limited insight as evidenced by difficulty understanding short term relief therapy brings.  Suicidal/Homicidal: Pt denies SI/HI no plan or intent to harm self or others reported.  Therapist Response: CSW assessed for changes in mood and behavior. Pt reports she has spent more time in the bed, avoiding interacting with others. Discussed with pt grief/loss process and explained how she is grieving loss of her relationship. Discussed with pt stages  of grief and validated her feelings. Brainstormed with pt self care activities to assist with managing the loss.  Plan: Return again in 1 weeks.  Diagnosis: Axis I:Adjustment disorder with mixed anxiety and depressed mood    Axis II: No diagnosis    Yvette Rack, LCSW 09/14/2020

## 2020-09-17 ENCOUNTER — Encounter: Payer: Self-pay | Admitting: Obstetrics & Gynecology

## 2020-09-20 ENCOUNTER — Ambulatory Visit: Payer: 59 | Admitting: Obstetrics & Gynecology

## 2020-09-20 ENCOUNTER — Other Ambulatory Visit: Payer: 59

## 2020-09-20 ENCOUNTER — Ambulatory Visit (INDEPENDENT_AMBULATORY_CARE_PROVIDER_SITE_OTHER): Payer: No Typology Code available for payment source | Admitting: Clinical

## 2020-09-20 ENCOUNTER — Other Ambulatory Visit: Payer: Self-pay

## 2020-09-20 DIAGNOSIS — F4323 Adjustment disorder with mixed anxiety and depressed mood: Secondary | ICD-10-CM | POA: Diagnosis not present

## 2020-09-20 NOTE — Progress Notes (Signed)
   THERAPIST PROGRESS NOTE 1 Session Time: 11am  Participation Level: Active  Behavioral Response: Casual and NeatAlertDysphoric  Type of Therapy: Individual Therapy  Treatment Goals addressed: Coping  Interventions: Supportive  Summary: Dana Manning is a 30 y.o. female who presents in a dysphoric mood after the recent break up with her partner. Pt says she is experiencing a void and unsure what to do with the free time she now has. Pt says she has started exercising and plans to start working with a personal trainer next week. Pt continues to reports period of sadness and tearfulness, denies any SI/HI or AH/VH.  Suicidal/Homicidal: Pt denies SI/HI no plan or intent to harm self or others reported.  Therapist Response: CSW assessed for changes in daily funtioning, mood and behavior. CSW prompted pt to identify healthy vs unhealthy characteristics in a relationship. CSW actively listened as pt discussed dynamics of relationship with ex-partner and overlooking unhealthy traits in her relationship. CSW challenged pt thinking as she displayed irrational thinking about the outcome of her next relationship. CSW explained to pt sxs of anxiety and ways to challenge automatic negative thinking. Pt encouraged to identify wants, needs, values in a relationship to be discussed during next session.  Plan: Return again in 1 weeks.  Diagnosis: Axis I: Adjustment disorder with mixed anxiety and depressed mood    Axis II: No diagnosis    Yvette Rack, LCSW 09/20/2020

## 2020-09-25 ENCOUNTER — Encounter: Payer: No Typology Code available for payment source | Admitting: Obstetrics & Gynecology

## 2020-09-27 ENCOUNTER — Ambulatory Visit (HOSPITAL_COMMUNITY): Payer: No Typology Code available for payment source | Admitting: Clinical

## 2020-09-27 ENCOUNTER — Other Ambulatory Visit: Payer: Self-pay

## 2020-10-04 ENCOUNTER — Other Ambulatory Visit: Payer: Self-pay

## 2020-10-04 ENCOUNTER — Ambulatory Visit (INDEPENDENT_AMBULATORY_CARE_PROVIDER_SITE_OTHER): Payer: No Typology Code available for payment source | Admitting: Clinical

## 2020-10-04 DIAGNOSIS — F4323 Adjustment disorder with mixed anxiety and depressed mood: Secondary | ICD-10-CM

## 2020-10-04 NOTE — Progress Notes (Signed)
° °  THERAPIST PROGRESS NOTE  Session Time: 11am  Participation Level: Active  Behavioral Response: CasualAlerttearful  Type of Therapy: Individual Therapy  Treatment Goals addressed:Boundaries, depression  Interventions: Supportive Location [facility] Summary: Brynna Dobos is a 30 y.o. female who presents in a tearful manner. Pt continues to report grieving the loss of her relationship with ex-partner. Pt states she has made attempts to get partners attention by posting messages on social media but reports she has had no success. Pt admits to oversharing on social media and experiencing regret after doing so. Pt discussed being a "people pleaser" and having never set boundaries in any of her relationships. Pt states she recently told a friend no and felt guilty for doing so and worries there relationship may end as a result of her doing so. Pt unable to identify what her wants, needs or desires are in a relationship and continues to have difficulty setting limits. Per pt report, she has begun to workout 2x per week with a personal trainer and is closely monitoring her eating habits. Pt admits having low self esteem and wanting to improve her self confidence.  Suicidal/Homicidal: Pt denies SI/HI no plan or intent to harm self or others reported.  Therapist Response: CSW reviewed with pt grieving process as she admits wanting "instant gratification" so she can hurry her healing process. CSW challenged pt and probed for feedback on what she hopes to accomplish by "oversharing on social media" CSW prompted pt to identify consequences she has received from people pleasing. CSW discussed with pt unhealthy vs healthy coping methods and potential positive outcomes that may come from implementing healthy coping strategies to manage stress and unwanted emotions.  Plan: Return again in 1 weeks.  Diagnosis: Axis I: Adjustment disorder with mixed anxiety and depressed mood    Axis II: No  diagnosis    Yvette Rack, LCSW 10/04/2020

## 2020-10-11 ENCOUNTER — Other Ambulatory Visit: Payer: Self-pay

## 2020-10-11 ENCOUNTER — Ambulatory Visit (INDEPENDENT_AMBULATORY_CARE_PROVIDER_SITE_OTHER): Payer: No Typology Code available for payment source | Admitting: Clinical

## 2020-10-11 DIAGNOSIS — F4323 Adjustment disorder with mixed anxiety and depressed mood: Secondary | ICD-10-CM

## 2020-10-11 NOTE — Progress Notes (Signed)
   THERAPIST PROGRESS NOTE  Session Time: 11am  Participation Level: Active  Behavioral Response: CasualAlertpleasant  Type of Therapy: Individual Therapy  Treatment Goals addressed: Anxiety  Interventions: CBT  Summary: Herlinda Heady is a 30 y.o. female who reports feeling pressured by her mother to return to college. Pt says she is interested in pursuing a nursing degree but would like to do so on her own timeline. Pt describes her mother as being a positive support but also causing her stress and anxiety. Pt states when she confronts her mother about her behavior, her mother makes her feel guilty for doing so. Pt admits her mother can be overly involved in her relationship. Pt says she has a tendency to [employer] information.   Suicidal/Homicidal: Pt denies SI/HI no plan or intent to harm self or others reported.  Therapist Response: CSW assessed for changes in mood and behavior. CSW provided pt with information on types of boundaries and traits of each type. CSW explained to pt her having pourous boundaries with her mother and assisted her in identifying ways to establish healthy boundaries between them. CSW discussed with pt utilizing socratic questions to challenge self defeating thoughts and replace with positive self talk.  Plan: Return again in 1 weeks.  Diagnosis: Axis I: Adjustment disorder with mixed anxiety and depressed mood    Axis II: No diagnosis    Yvette Rack, LCSW 10/11/2020

## 2020-10-18 ENCOUNTER — Other Ambulatory Visit: Payer: Self-pay

## 2020-10-18 ENCOUNTER — Ambulatory Visit (INDEPENDENT_AMBULATORY_CARE_PROVIDER_SITE_OTHER): Payer: No Typology Code available for payment source | Admitting: Clinical

## 2020-10-18 DIAGNOSIS — F4323 Adjustment disorder with mixed anxiety and depressed mood: Secondary | ICD-10-CM

## 2020-10-18 NOTE — Progress Notes (Signed)
   THERAPIST PROGRESS NOTE  Session Time: 11am  Participation Level: Active  Behavioral Response: Casual and NeatAlertpleasant  Type of Therapy: Individual Therapy  Treatment Goals addressed: Anxiety  Interventions: Supportive  Summary: Dana Manning is a 30 y.o. female who presents in a pleasant manner.pt reports she is looking forward to going on on vacation next month with friends. Pt says has sleep cycle has been altered due to varying work schedule. Pt discussed recent incident on social media where ex-partner mother made inappropriate remarks about her. Pt expressed hurt as ex-partner did not come to her defense. Pt admits she has not blocked ex partner on social media because she wants to post pictures to get his attention.  Suicidal/Homicidal:Pt denies SI/HI no plan or intent to harm self or others reported.  Therapist Response: CSW assessed for changes in mood and behavior. CSW reviewed with pt types of boundaries and traits. CSW discussed with pt potential consequences of establishing unhealthy boundaries. CSW utilized socratic questions to challenge pt irrational beliefs about herself and relationship with ex-partner. CSW continues to encourage pt to practice daily self care and verbally praised her for engaging in physical exercise as a part of self care routine.   Plan: Return again in 1 weeks.  Diagnosis: Axis I:Adjustment disorder with mixed anxiety and depressed mood    Axis II: No diagnosis    Yvette Rack, LCSW 10/18/2020

## 2020-10-25 ENCOUNTER — Ambulatory Visit (HOSPITAL_COMMUNITY): Payer: No Typology Code available for payment source | Admitting: Clinical

## 2020-11-08 ENCOUNTER — Other Ambulatory Visit: Payer: Self-pay

## 2020-11-08 ENCOUNTER — Ambulatory Visit (INDEPENDENT_AMBULATORY_CARE_PROVIDER_SITE_OTHER): Payer: No Typology Code available for payment source | Admitting: Clinical

## 2020-11-08 DIAGNOSIS — F4323 Adjustment disorder with mixed anxiety and depressed mood: Secondary | ICD-10-CM | POA: Diagnosis not present

## 2020-11-08 NOTE — Progress Notes (Signed)
   THERAPIST PROGRESS NOTE  Session Time: 11am  Participation Level: Active  Behavioral Response: CasualAlertpleasant, tearful at times  Type of Therapy: Individual Therapy  Treatment Goals addressed: Anxiety  Interventions: Supportive  Summary: Dana Manning is a 30 y.o. female who presents in a pleasant manner and tearful at times. Pt reports she has been experiencing panic attacks. Pt says she is still adjusting to the break up with ex-partner.  Pt states she recently when to partner's home to retrieve items she left there and reports him being mean to her. Pt says she still follows partner on social media and responds if he calls or texts her. Pt states she has been unable to sever all ties with partner.  Suicidal/Homicidal: Pt denies SI/HI no plan or intent to harm self or others reported.  Therapist Response: CSW reviewed with pt traits of healthy vs unhealthy boundaries. CSW reviewed and provided pt with information on deep breathing exercises to assist with managing anxiety or panic attacks. CSW continues to encourage pt to engage in daily self care routine to assist her in improving her overall well being.  Plan: Return again in 2 weeks.  Diagnosis: Axis I: Adjustment disorder with mixed anxiety and depressed mood    Axis II: No diagnosis    Yvette Rack, LCSW 11/08/2020

## 2020-11-09 ENCOUNTER — Other Ambulatory Visit (HOSPITAL_COMMUNITY)
Admission: RE | Admit: 2020-11-09 | Discharge: 2020-11-09 | Disposition: A | Discharge status: Home / Self Care | Payer: Self-pay | Source: Ambulatory Visit | Attending: Obstetrics & Gynecology | Admitting: Obstetrics & Gynecology

## 2020-11-09 ENCOUNTER — Other Ambulatory Visit: Payer: Self-pay

## 2020-11-09 ENCOUNTER — Encounter: Payer: Self-pay | Admitting: Obstetrics & Gynecology

## 2020-11-09 ENCOUNTER — Ambulatory Visit (INDEPENDENT_AMBULATORY_CARE_PROVIDER_SITE_OTHER): Payer: No Typology Code available for payment source | Admitting: Obstetrics & Gynecology

## 2020-11-09 VITALS — BP 118/76 | Ht 61.0 in | Wt 152.0 lb

## 2020-11-09 DIAGNOSIS — Z789 Other specified health status: Secondary | ICD-10-CM | POA: Diagnosis not present

## 2020-11-09 DIAGNOSIS — Z01419 Encounter for gynecological examination (general) (routine) without abnormal findings: Secondary | ICD-10-CM | POA: Insufficient documentation

## 2020-11-09 DIAGNOSIS — Z113 Encounter for screening for infections with a predominantly sexual mode of transmission: Secondary | ICD-10-CM | POA: Diagnosis not present

## 2020-11-09 NOTE — Progress Notes (Signed)
Dana Manning 01/17/1991 [id number]   History:    30 y.o. Go Single.  Broke up with boyfriend.  Going back to school in nursing.  RP:  Established patient presenting for annual gyn exam   HPI: Menses regular normal every month.  BTB present, seen with a Pelvic US 09/13/2020 thickened endometrium, Sonohysto scheduled.  No pelvic pain.  No vaginal d/c.  Currently abstinent.  Just broke up with boyfriend, so would like to do STI screen.  Breasts normal.  BMI 28.72.    Past medical history,surgical history, family history and social history were all reviewed and documented in the EPIC chart.  Gynecologic History [name] was 10/22/2020.  Obstetric History OB History  Gravida Para Term Preterm AB Living  0 0 0 0 0 0  SAB IAB Ectopic Multiple Live Births  0 0 0 0 0     ROS: A ROS was performed and pertinent positives and negatives are included in the history.  GENERAL: No fevers or chills. HEENT: No change in vision, no earache, sore throat or sinus congestion. NECK: No pain or stiffness. CARDIOVASCULAR: No chest pain or pressure. No palpitations. PULMONARY: No shortness of breath, cough or wheeze. GASTROINTESTINAL: No abdominal pain, nausea, vomiting or diarrhea, melena or bright red blood per rectum. GENITOURINARY: No urinary frequency, urgency, hesitancy or dysuria. MUSCULOSKELETAL: No joint or muscle pain, no back pain, no recent trauma. DERMATOLOGIC: No rash, no itching, no lesions. ENDOCRINE: No polyuria, polydipsia, no heat or cold intolerance. No recent change in weight. HEMATOLOGICAL: No anemia or easy bruising or bleeding. NEUROLOGIC: No headache, seizures, numbness, tingling or weakness. PSYCHIATRIC: No depression, no loss of interest in normal activity or change in sleep pattern.     Exam:   BP 118/76   Ht 5\' 1"  (1.549 m)   Wt 152 lb (68.9 kg)   LMP 10/22/2020 Comment: no birth control   BMI 28.72 kg/m   Body mass index is 28.72 kg/m.  General  appearance : Well developed well nourished female. No acute distress HEENT: Eyes: no retinal hemorrhage or exudates,  Neck supple, trachea midline, no carotid bruits, no thyroidmegaly Lungs: Clear to auscultation, no rhonchi or wheezes, or rib retractions  Heart: Regular rate and rhythm, no murmurs or gallops Breast:Examined in sitting and supine position were symmetrical in appearance, no palpable masses or tenderness,  no skin retraction, no nipple inversion, no nipple discharge, no skin discoloration, no axillary or supraclavicular lymphadenopathy Abdomen: no palpable masses or tenderness, no rebound or guarding Extremities: no edema or skin discoloration or tenderness  Pelvic: Vulva: Normal             Vagina: No gross lesions or discharge  Cervix: No gross lesions or discharge.  Pap reflex/Gono-Chlam  Uterus  AV, normal size, shape and consistency, non-tender and mobile  Adnexa  Without masses or tenderness  Anus: Normal   Assessment/Plan:  30 y.o. female for annual exam   1. Encounter for routine gynecological examination with Papanicolaou smear of cervix Menses normal, but breakthrough bleeding still present.  Recent endometrial thickening per ultrasound.  Sonohysterogram scheduled.  Pap reflex done.  Breast exam normal.  Body mass index 28.72.  Fitness and healthy nutrition.  Health labs with family physician as needed. - Cytology - PAP( [facility])  2. Use of condoms for contraception Currently abstinent.  3. Screen for STD (sexually transmitted disease) Condom use as needed. - HIV antibody (with reflex) - [facility] - Hepatitis B Surface AntiGEN -  Hepatitis C Antibody  [name] MD, 12:03 PM 11/09/2020

## 2020-11-10 ENCOUNTER — Encounter: Payer: Self-pay | Admitting: Obstetrics & Gynecology

## 2020-11-12 LAB — HEPATITIS B SURFACE ANTIGEN: Hepatitis B Surface Ag: NONREACTIVE

## 2020-11-12 LAB — HEPATITIS C ANTIBODY
Hepatitis C Ab: NONREACTIVE
SIGNAL TO CUT-OFF: 0.31 (ref <1.00)

## 2020-11-12 LAB — RPR: RPR Ser Ql: NONREACTIVE

## 2020-11-12 LAB — HIV ANTIBODY (ROUTINE TESTING W REFLEX): HIV 1&2 Ab, 4th Generation: NONREACTIVE

## 2020-11-14 LAB — CYTOLOGY - PAP
Chlamydia: NEGATIVE
Comment: NEGATIVE
Comment: NORMAL
Diagnosis: NEGATIVE
Neisseria Gonorrhea: NEGATIVE

## 2020-11-15 ENCOUNTER — Other Ambulatory Visit: Payer: Self-pay

## 2020-11-15 ENCOUNTER — Ambulatory Visit (INDEPENDENT_AMBULATORY_CARE_PROVIDER_SITE_OTHER): Payer: No Typology Code available for payment source | Admitting: Clinical

## 2020-11-15 DIAGNOSIS — F4323 Adjustment disorder with mixed anxiety and depressed mood: Secondary | ICD-10-CM | POA: Diagnosis not present

## 2020-11-15 NOTE — Progress Notes (Signed)
   THERAPIST PROGRESS NOTE  Session Time: 2pm  Participation Level: Active  Behavioral Response: CasualAlertpleasant, tearful at times  Type of Therapy: Individual Therapy  Treatment Goals addressed: Anxiety  Interventions: CBT  Summary: Dana Manning is a 30 y.o. female who presents in a pleasant manner. Pt reports she is looking forward to going on vacation next week. Pt reports she practices daily affirmations and finds this beneficial. Pt says she forgets to journal at least 2x week but agreeable to do so as she states journaling gives her an opportunity to practice self reflection.   Suicidal/Homicidal: Pt denies SI/HI no plan or intent to harm self or others reported.  Therapist Response: CSW reviewed treatment goals with pt and asssessed progress toward achieving goals. CSW discussed with pt what irrational thoughts are and her tendency to personalize events that are out of her control. CSW provided pt with examples of how this was reflected in her last relationship. CSW probed for feedback on consequences received for personalizing events in her personal and professional life. CSW continues to discuss with pt ways to challenge negative thinking as pt has reported difficulty challenging unwanted thoughts and emotions.  Plan: Return again in 1 weeks.  Diagnosis: Axis I: adjustment disorder with mixed anxiety and depressed mood    Axis II: No diagnosis    Yvette Rack, LCSW 11/15/2020

## 2020-11-21 ENCOUNTER — Other Ambulatory Visit: Payer: Self-pay

## 2020-11-21 ENCOUNTER — Encounter (HOSPITAL_COMMUNITY): Payer: Self-pay | Admitting: Psychiatry

## 2020-11-21 ENCOUNTER — Ambulatory Visit (INDEPENDENT_AMBULATORY_CARE_PROVIDER_SITE_OTHER): Payer: No Typology Code available for payment source | Admitting: Clinical

## 2020-11-21 DIAGNOSIS — F4323 Adjustment disorder with mixed anxiety and depressed mood: Secondary | ICD-10-CM

## 2020-11-21 NOTE — Progress Notes (Signed)
   THERAPIST PROGRESS NOTE  Session Time: 1pm  Participation Level: Active  Behavioral Response: CasualAlertEuthymic  Type of Therapy: Individual Therapy  Treatment Goals addressed: Anxiety  Interventions: Reframing  Summary: Viva Gallaher is a 30 y.o. female who presents in euthymic mood. Pt appears to be proud of the progress she is making toward improving her physical health. Pt says she is looking forward to going to [facility] for vacation this week. Pt admits having difficulty challenging negative thinking and says she avoids situations that trigger negative emotions.   Suicidal/Homicidal: Pt denies SI/HI no plan or intent to harm self or others reported.  Therapist Response: CSW provided pt with information on cognitive restructuring. CSW modeled for pt how to implement cognitive restructuring. CSW introduced to pt cognitive reframing to assist her with challenging irrational beliefs. CSW provided examples of reframing and modeled skill for pt. CSW discussed with pt radical acceptance to assist her in acknowledging situations for what they are and not as she would like them to be. CSW encouraged pt to use cost vs benefit to determine if situation/relationship adds value to her life.  Plan: Return again in 2 weeks.  Diagnosis: Axis I: adjustment disorder with mixed anxiety and depressed mood    Axis II: No diagnosis    Yvette Rack, LCSW 11/21/2020

## 2020-11-29 ENCOUNTER — Ambulatory Visit (INDEPENDENT_AMBULATORY_CARE_PROVIDER_SITE_OTHER): Payer: No Typology Code available for payment source | Admitting: Clinical

## 2020-11-29 ENCOUNTER — Other Ambulatory Visit: Payer: Self-pay

## 2020-11-29 DIAGNOSIS — F4323 Adjustment disorder with mixed anxiety and depressed mood: Secondary | ICD-10-CM | POA: Diagnosis not present

## 2020-11-29 NOTE — Progress Notes (Signed)
   THERAPIST PROGRESS NOTE  Session Time: 11am  Participation Level: Active  Behavioral Response: Casual and NeatAlertEuthymic  Type of Therapy: Individual Therapy  Treatment Goals addressed: Coping  Interventions: CBT  Summary: Dana Manning is a 30 y.o. female who presents in euthymic mood. Pt reports enjoying herself while on vacation last week. Pt says she is looking forward to her next vacation in July. Pt reports some improvement with managing her mood and states finding having a self care routine to manage distressing situations. No change in mood or behavior reported.  Suicidal/Homicidal: Pt denies SI/HI no plan or intent to harm self or others reported.  Therapist Response: CSW reviewed with pt cognitive restructuring and reframing to challenge irrational beliefs. CSW also reviewed cost vs benefit as it relates to current, past relationships pt has. CSW verbally praised pt for progress she is making toward achieving treatment goals as evidenced by routine exercise, applying daily affirmations.  Plan: Return again in 1 weeks.  Diagnosis: Axis I:adjustment disorder with mixed anxiety and depressed mood    Axis II: No diagnosis    Yvette Rack, LCSW 11/29/2020

## 2020-12-06 ENCOUNTER — Telehealth (HOSPITAL_COMMUNITY): Payer: Self-pay | Admitting: Psychiatry

## 2020-12-06 ENCOUNTER — Other Ambulatory Visit: Payer: Self-pay

## 2020-12-06 ENCOUNTER — Ambulatory Visit (INDEPENDENT_AMBULATORY_CARE_PROVIDER_SITE_OTHER): Payer: No Typology Code available for payment source | Admitting: Clinical

## 2020-12-06 DIAGNOSIS — F4323 Adjustment disorder with mixed anxiety and depressed mood: Secondary | ICD-10-CM | POA: Diagnosis not present

## 2020-12-06 NOTE — Telephone Encounter (Signed)
D:  Sanjuana Kava, LCSW referred pt to [facility].  A:  Placed call to orient pt.  Pt states she would like to contact her insurance company before deciding. Encouraged pt to contact case manager once she speaks to them. Inform Darren.  R:  Pt receptive.
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
## 2020-12-19 ENCOUNTER — Ambulatory Visit (INDEPENDENT_AMBULATORY_CARE_PROVIDER_SITE_OTHER): Payer: No Typology Code available for payment source | Admitting: Clinical

## 2020-12-19 ENCOUNTER — Other Ambulatory Visit: Payer: Self-pay

## 2020-12-19 DIAGNOSIS — F4323 Adjustment disorder with mixed anxiety and depressed mood: Secondary | ICD-10-CM

## 2020-12-19 NOTE — Progress Notes (Signed)
   THERAPIST PROGRESS NOTE  Session Time: 1pm  Participation Level: Active  Behavioral Response: CasualAlerttearful  Type of Therapy: Individual Therapy  Treatment Goals addressed: Coping  Interventions: CBT  Summary: Dana Manning is a 30 y.o. female who declines referral to [facility] due to cost of program. Pt encouraged to let writer or Dellia Nims know if she changes mind about participating in group.  Pt reports she continues to follow her ex-partner on social media and notices mood changes when she visits his page. Pt says when she is unable to see his page she will use a friends account to view the page. Pt acknowledges that doing this does not add value to her life but says she has difficulty fighting the urge to do so. Pt discussed how reading social media comments alters her mood. Pt states she has a tendency of allowing negative comments to ruminate  Suicidal/Homicidal: Pt denies SI/HI no plan or intent to harm self or others reported.  Therapist Response: CSW dsicussed with pt utilizing socratic questions to challnege irrational beliefs. CSW actively listened as pt discussed change in mood that occurs when reading social media comments. CSW listened as pt discussed change in mood when looking at ex-partner social media page. CSW discussed with pt taking a time out from social media and doing something that promotes her wellness when having the urge to view ex-partner social media page.  Plan: Return again in 1 weeks.  Diagnosis: Axis I: adjustment disorder with mixed anxiety and depressed mood    Axis II: No diagnosis    Yvette Rack, LCSW 12/19/2020

## 2021-01-02 ENCOUNTER — Other Ambulatory Visit: Payer: Self-pay

## 2021-01-02 ENCOUNTER — Ambulatory Visit (INDEPENDENT_AMBULATORY_CARE_PROVIDER_SITE_OTHER): Payer: No Typology Code available for payment source | Admitting: Clinical

## 2021-01-02 DIAGNOSIS — F4323 Adjustment disorder with mixed anxiety and depressed mood: Secondary | ICD-10-CM
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
## 2021-01-03 ENCOUNTER — Other Ambulatory Visit: Payer: Self-pay

## 2021-01-03 ENCOUNTER — Emergency Department (HOSPITAL_BASED_OUTPATIENT_CLINIC_OR_DEPARTMENT_OTHER): Payer: No Typology Code available for payment source

## 2021-01-03 ENCOUNTER — Emergency Department (HOSPITAL_BASED_OUTPATIENT_CLINIC_OR_DEPARTMENT_OTHER)
Admission: EM | Admit: 2021-01-03 | Discharge: 2021-01-03 | Disposition: A | Discharge status: Home / Self Care | Payer: No Typology Code available for payment source | Attending: Emergency Medicine | Admitting: Emergency Medicine

## 2021-01-03 DIAGNOSIS — Z20822 Contact with and (suspected) exposure to covid-19: Secondary | ICD-10-CM | POA: Diagnosis not present

## 2021-01-03 DIAGNOSIS — R1084 Generalized abdominal pain: Secondary | ICD-10-CM

## 2021-01-03 DIAGNOSIS — A084 Viral intestinal infection, unspecified: Secondary | ICD-10-CM | POA: Diagnosis not present

## 2021-01-03 DIAGNOSIS — K2970 Gastritis, unspecified, without bleeding: Secondary | ICD-10-CM

## 2021-01-03 DIAGNOSIS — R1011 Right upper quadrant pain: Secondary | ICD-10-CM | POA: Diagnosis present

## 2021-01-03 LAB — COMPREHENSIVE METABOLIC PANEL
ALT: 42 U/L (ref 0–44)
AST: 35 U/L (ref 15–41)
Albumin: 4.6 g/dL (ref 3.5–5.0)
Alkaline Phosphatase: 49 U/L (ref 38–126)
Anion gap: 10 (ref 5–15)
BUN: 12 mg/dL (ref 6–20)
CO2: 21 mmol/L — ABNORMAL LOW (ref 22–32)
Calcium: 9.2 mg/dL (ref 8.9–10.3)
Chloride: 105 mmol/L (ref 98–111)
Creatinine, Ser: 0.53 mg/dL (ref 0.44–1.00)
GFR, Estimated: >60 mL/min (ref >60)
Glucose, Bld: 106 mg/dL — ABNORMAL HIGH (ref 70–99)
Potassium: 3.5 mmol/L (ref 3.5–5.1)
Sodium: 136 mmol/L (ref 135–145)
Total Bilirubin: 1.5 mg/dL — ABNORMAL HIGH (ref 0.3–1.2)
Total Protein: 8 g/dL (ref 6.5–8.1)

## 2021-01-03 LAB — CBC WITH DIFFERENTIAL/PLATELET
Abs Immature Granulocytes: 0.02 10*3/uL (ref 0.00–0.07)
Basophils Absolute: 0 10*3/uL (ref 0.0–0.1)
Basophils Relative: 0 %
Eosinophils Absolute: 0.2 10*3/uL (ref 0.0–0.5)
Eosinophils Relative: 2 %
HCT: 36.4 % (ref 36.0–46.0)
Hemoglobin: 11.8 g/dL — ABNORMAL LOW (ref 12.0–15.0)
Immature Granulocytes: 0 %
Lymphocytes Relative: 7 %
Lymphs Abs: 0.6 10*3/uL — ABNORMAL LOW (ref 0.7–4.0)
MCH: 25.2 pg — ABNORMAL LOW (ref 26.0–34.0)
MCHC: 32.4 g/dL (ref 30.0–36.0)
MCV: 77.8 fL — ABNORMAL LOW (ref 80.0–100.0)
Monocytes Absolute: 0.3 10*3/uL (ref 0.1–1.0)
Monocytes Relative: 4 %
Neutro Abs: 7.4 10*3/uL (ref 1.7–7.7)
Neutrophils Relative %: 87 %
Platelets: 427 10*3/uL — ABNORMAL HIGH (ref 150–400)
RBC: 4.68 MIL/uL (ref 3.87–5.11)
RDW: 14.9 % (ref 11.5–15.5)
WBC: 8.6 10*3/uL (ref 4.0–10.5)
nRBC: 0 % (ref 0.0–0.2)

## 2021-01-03 LAB — URINALYSIS, MICROSCOPIC (REFLEX): WBC, UA: NONE SEEN WBC/hpf (ref 0–5)

## 2021-01-03 LAB — URINALYSIS, ROUTINE W REFLEX MICROSCOPIC
Bilirubin Urine: NEGATIVE
Glucose, UA: NEGATIVE mg/dL
Ketones, ur: 40 mg/dL — AB
Leukocytes,Ua: NEGATIVE
Nitrite: NEGATIVE
Protein, ur: NEGATIVE mg/dL
Specific Gravity, Urine: 1.015 (ref 1.005–1.030)
pH: 8 (ref 5.0–8.0)

## 2021-01-03 LAB — LIPASE, BLOOD: Lipase: 29 U/L (ref 11–51)

## 2021-01-03 LAB — PREGNANCY, URINE: Preg Test, Ur: NEGATIVE

## 2021-01-03 LAB — RESP PANEL BY RT-PCR (FLU A&B, COVID) ARPGX2
Influenza A by PCR: NEGATIVE
Influenza B by PCR: NEGATIVE
SARS Coronavirus 2 by RT PCR: NEGATIVE

## 2021-01-03 MED ORDER — HYDROMORPHONE HCL 1 MG/ML IJ SOLN
0.5000 mg | Freq: Once | INTRAMUSCULAR | Status: AC
Start: 2021-01-03 — End: 2021-01-03
  Administered 2021-01-03: 0.5 mg via INTRAVENOUS
  Filled 2021-01-03: qty 1

## 2021-01-03 MED ORDER — METOCLOPRAMIDE HCL 5 MG/ML IJ SOLN
10.0000 mg | Freq: Once | INTRAMUSCULAR | Status: AC
  Administered 2021-01-03: 10 mg via INTRAVENOUS
  Filled 2021-01-03: qty 2

## 2021-01-03 MED ORDER — FENTANYL CITRATE (PF) 100 MCG/2ML IJ SOLN
50.0000 ug | Freq: Once | INTRAMUSCULAR | Status: AC
  Administered 2021-01-03: 50 ug via INTRAVENOUS
  Filled 2021-01-03: qty 2

## 2021-01-03 MED ORDER — IOHEXOL 300 MG/ML  SOLN
100.0000 mL | Freq: Once | INTRAMUSCULAR | Status: AC | PRN
  Administered 2021-01-03: 100 mL via INTRAVENOUS

## 2021-01-03 MED ORDER — ONDANSETRON 4 MG PO TBDP: 4.0000 mg | ORAL_TABLET | Freq: Three times a day (TID) | ORAL | 0 refills | Status: DC | PRN

## 2021-01-03 MED ORDER — ONDANSETRON HCL 4 MG/2ML IJ SOLN
4.0000 mg | Freq: Once | INTRAMUSCULAR | Status: AC
  Administered 2021-01-03: 4 mg via INTRAVENOUS
  Filled 2021-01-03: qty 2

## 2021-01-03 MED ORDER — SODIUM CHLORIDE 0.9 % IV BOLUS
1000.0000 mL | Freq: Once | INTRAVENOUS | Status: AC
  Administered 2021-01-03: 1000 mL via INTRAVENOUS

## 2021-01-03 NOTE — ED Triage Notes (Signed)
Abdominal pain sharp pain in her epigastric area. Vomiting. Fever.

## 2021-01-03 NOTE — ED Provider Notes (Signed)
[facility] EMERGENCY DEPARTMENT Provider Note   CSN: [id number] Arrival date & time: 01/03/21  1833     History Chief Complaint  Patient presents with   Abdominal Pain    Dana Manning is a 30 y.o. female.  Presented to ER with concern for abdominal pain.  Patient reports that yesterday she was asymptomatic.  Today having no abdominal pain.  States pain is in her upper abdomen, right upper abdomen.  Sharp at times but also aching.  Moderate.  Associated with nausea and vomiting.  States vomit had some green and yellow stuff.  No blood.  Chills but no fevers at home.  No lower abdominal pain.  Has history of appendectomy.  No vaginal discharge, no pelvic pain.  No dysuria or hematuria.  HPI     Past Medical History:  Diagnosis Date   Anemia    09-07-2017 per pt mild   Endometrial polyp    Metrorrhagia     Patient Active Problem List   Diagnosis Date Noted   Intramural leiomyoma of uterus 05/14/2016   Bloating symptom 09/21/2015   Epigastric pain 09/21/2015   Excessive and frequent menstruation with regular cycle 09/21/2015    Past Surgical History:  Procedure Laterality Date   APPENDECTOMY  2006   DILATATION & CURETTAGE/HYSTEROSCOPY WITH MYOSURE N/A 06/20/2016   Procedure: DILATATION & CURETTAGE/HYSTEROSCOPY WITH MYOSURE;  Surgeon: Terrance Mass, MD;  Location: [facility];  Service: Gynecology;  Laterality: N/A;     DILATATION & CURETTAGE/HYSTEROSCOPY WITH MYOSURE N/A 09/18/2017   Procedure: DILATATION & CURETTAGE/HYSTEROSCOPY WITH MYOSURE;  Surgeon: [name], MD;  Location: [facility];  Service: Gynecology;  Laterality: N/A;  requests 7:30am OR time Requests one hour   TONSILLECTOMY  age 9     OB History     Gravida  0   Para  0   Term  0   Preterm  0   AB  0   Living  0      SAB  0   IAB  0   Ectopic  0   Multiple  0   Live Births  0           Family History  Problem Relation Age of Onset   Cancer  Maternal Grandmother        SMALL INTESTINE-    Cancer Maternal Grandfather        PROSTATE- LYMPHOMA    Social History   Tobacco Use   Smoking status: Never   Smokeless tobacco: Never  Vaping Use   Vaping Use: Never used  Substance Use Topics   Alcohol use: No   Drug use: No    Home Medications Prior to Admission medications   Medication Sig Start Date End Date Taking? Authorizing Provider  ondansetron (ZOFRAN ODT) 4 MG disintegrating tablet Take 1 tablet (4 mg total) by mouth every 8 (eight) hours as needed for nausea or vomiting. 01/03/21  Yes Zan Orlick, Ellwood Dense, MD    Allergies    Patient has no known allergies.  Review of Systems   Review of Systems  Constitutional:  Positive for chills. Negative for fever.  HENT:  Negative for ear pain and sore throat.   Eyes:  Negative for pain and visual disturbance.  Respiratory:  Negative for cough and shortness of breath.   Cardiovascular:  Negative for chest pain and palpitations.  Gastrointestinal:  Positive for abdominal pain, nausea and vomiting.  Genitourinary:  Negative for dysuria and hematuria.  Musculoskeletal:  Negative for arthralgias and back pain.  Skin:  Negative for color change and rash.  Neurological:  Negative for seizures and syncope.  All other systems reviewed and are negative.  Physical Exam Updated Vital Signs BP 102/74   Pulse (!) 104   Temp 100.3 F (37.9 C) (Oral)   Resp 19   Ht 5\' 1"  (1.549 m)   Wt 67 kg   LMP 12/26/2020   SpO2 100%   BMI 27.89 kg/m   Physical Exam Vitals and nursing note reviewed.  Constitutional:      General: She is not in acute distress.    Appearance: She is well-developed.  HENT:     Head: Normocephalic and atraumatic.  Eyes:     Conjunctiva/sclera: Conjunctivae normal.  Cardiovascular:     Rate and Rhythm: Normal rate and regular rhythm.     Heart sounds: No murmur heard. Pulmonary:     Effort: Pulmonary effort is normal. No respiratory distress.      Breath sounds: Normal breath sounds.  Abdominal:     Palpations: Abdomen is soft. There is no mass or pulsatile mass.     Tenderness: There is abdominal tenderness in the right upper quadrant. There is no guarding or rebound.  Musculoskeletal:     Cervical back: Neck supple.  Skin:    General: Skin is warm and dry.  Neurological:     Mental Status: She is alert.    ED Results / Procedures / Treatments   Labs (all labs ordered are listed, but only abnormal results are displayed) Labs Reviewed  CBC WITH DIFFERENTIAL/PLATELET - Abnormal; Notable for the following components:      Result Value   Hemoglobin 11.8 (*)    MCV 77.8 (*)    MCH 25.2 (*)    Platelets 427 (*)    Lymphs Abs 0.6 (*)    All other components within normal limits  COMPREHENSIVE METABOLIC PANEL - Abnormal; Notable for the following components:   CO2 21 (*)    Glucose, Bld 106 (*)    Total Bilirubin 1.5 (*)    All other components within normal limits  URINALYSIS, ROUTINE W REFLEX MICROSCOPIC - Abnormal; Notable for the following components:   Hgb urine dipstick TRACE (*)    Ketones, ur 40 (*)    All other components within normal limits  URINALYSIS, MICROSCOPIC (REFLEX) - Abnormal; Notable for the following components:   Bacteria, UA RARE (*)    All other components within normal limits  RESP PANEL BY RT-PCR (FLU A&B, COVID) ARPGX2  LIPASE, BLOOD  PREGNANCY, URINE    EKG EKG Interpretation  Date/Time:  Thursday January 03 2021 18:52:46 EDT Ventricular Rate:  94 PR Interval:  135 QRS Duration: 90 QT Interval:  357 QTC Calculation: 447 R Axis:   77 Text Interpretation: Sinus rhythm T wave abnormalities Confirmed by Elnora Morrison [phone]) on 01/04/2021 2:49:17 PM  Radiology CT ABDOMEN PELVIS W CONTRAST  Result Date: 01/03/2021 CLINICAL DATA:  Fever abdominal pain EXAM: CT ABDOMEN AND PELVIS WITH CONTRAST TECHNIQUE: Multidetector CT imaging of the abdomen and pelvis was performed using the standard  protocol following bolus administration of intravenous contrast. CONTRAST:  119mL OMNIPAQUE IOHEXOL 300 MG/ML  SOLN COMPARISON:  Ultrasound 01/03/2021, ultrasound 09/13/2020 FINDINGS: Lower chest: No acute abnormality. Hepatobiliary: Focal fat infiltration near falciform ligament felt to correspond to the echogenic mass on ultrasound. No gallstones, gallbladder wall thickening, or biliary dilatation. Pancreas: Unremarkable. No pancreatic ductal dilatation or surrounding inflammatory changes.  Spleen: Normal in size without focal abnormality. Adrenals/Urinary Tract: Adrenal glands are unremarkable. Kidneys are normal, without renal calculi, focal lesion, or hydronephrosis. Bladder is unremarkable. Stomach/Bowel: Stomach is within normal limits. Status post appendectomy. No evidence of bowel wall thickening, distention, or inflammatory changes. Vascular/Lymphatic: No significant vascular findings are present. No enlarged abdominal or pelvic lymph nodes. Reproductive: Probable arcuate configuration of the uterus. Prominent endometrial stripe measuring up to 19 mm. No adnexal mass. Other: No abdominal wall hernia or abnormality. No abdominopelvic ascites. Musculoskeletal: No acute or significant osseous findings. IMPRESSION: 1. No CT evidence for acute intra-abdominal or pelvic abnormality. 2. Prominent endometrial complex, refer to prior ultrasound 09/13/2020. Electronically Signed   By: Donavan Foil M.D.   On: 01/03/2021 22:13   US Abdomen Limited  Result Date: 01/03/2021 CLINICAL DATA:  Right upper quadrant pain EXAM: ULTRASOUND ABDOMEN LIMITED RIGHT UPPER QUADRANT COMPARISON:  07/08/2018 FINDINGS: Gallbladder: No gallstones or wall thickening visualized. No sonographic Murphy sign noted by sonographer. Common bile duct: Diameter: 3.3 mm Liver: Echogenic solid appearing mass within the left hepatic lobe measures 2.2 x 1.1 x 2 cm. Portal vein is patent on color Doppler imaging with normal direction of blood flow  towards the liver. Other: None. IMPRESSION: 1. Negative for gallstones 2. 2.2 cm echogenic mass within the left hepatic lobe suggestive of hemangioma. Electronically Signed   By: Donavan Foil M.D.   On: 01/03/2021 19:41    Procedures Procedures   Medications Ordered in ED Medications  ondansetron (ZOFRAN) injection 4 mg (4 mg Intravenous Given 01/03/21 1945)  fentaNYL (SUBLIMAZE) injection 50 mcg (50 mcg Intravenous Given 01/03/21 1947)  sodium chloride 0.9 % bolus 1,000 mL ( Intravenous Stopped 01/03/21 2046)  HYDROmorphone (DILAUDID) injection 0.5 mg (0.5 mg Intravenous Given 01/03/21 2138)  iohexol (OMNIPAQUE) 300 MG/ML solution 100 mL (100 mLs Intravenous Contrast Given 01/03/21 2155)  metoCLOPramide (REGLAN) injection 10 mg (10 mg Intravenous Given 01/03/21 2148)    ED Course  I have reviewed the triage vital signs and the nursing notes.  Pertinent labs & imaging results that were available during my care of the patient were reviewed by me and considered in my medical decision making (see chart for details).    MDM Rules/Calculators/A&P                          30 year old lady presented to ER with concern for nausea, abdominal pain, vomiting.  On arrival here patient well-appearing, not in distress, noted to have borderline temperature.  Had some tenderness in right upper quadrant.  Checked labs, right upper quadrant ultrasound initially and provided fluids, analgesic and pain medicine.  Basic labs grossly stable.  No UTI.  Right upper quadrant ultrasound negative for acute pathology.  Check CT scan to further evaluate.  No acute findings on CT.  Suspect most likely viral gastritis given reassuring work-up and symptomatology.  On subsequent reassessments, patient symptoms remain well controlled throughout ER stay.  Tolerating p.o.  Believe she is appropriate for outpatient management at present, recommend recheck with primary doctor, reviewed return precautions and discharged home.  After  the discussed management above, the patient was determined to be safe for discharge.  The patient was in agreement with this plan and all questions regarding their care were answered.  ED return precautions were discussed and the patient will return to the ED with any significant worsening of condition.  Final Clinical Impression(s) / ED Diagnoses Final diagnoses:  Generalized abdominal  pain  Viral gastritis    Rx / DC Orders ED Discharge Orders          Ordered    ondansetron (ZOFRAN ODT) 4 MG disintegrating tablet  Every 8 hours PRN        01/03/21 2232             Lucrezia Starch, MD 01/04/21 [id number]

## 2021-01-03 NOTE — Discharge Instructions (Addendum)
Please follow-up with your primary doctor.  Take Zofran as needed for nausea.  If you develop worsening vomiting, abdominal pain, fevers, come back to ER for reassessment.

## 2021-01-16 ENCOUNTER — Ambulatory Visit (INDEPENDENT_AMBULATORY_CARE_PROVIDER_SITE_OTHER): Payer: No Typology Code available for payment source | Admitting: Clinical

## 2021-01-16 ENCOUNTER — Other Ambulatory Visit: Payer: Self-pay

## 2021-01-16 DIAGNOSIS — F4323 Adjustment disorder with mixed anxiety and depressed mood: Secondary | ICD-10-CM
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
## 2021-01-23 ENCOUNTER — Ambulatory Visit (INDEPENDENT_AMBULATORY_CARE_PROVIDER_SITE_OTHER): Payer: No Typology Code available for payment source | Admitting: Clinical

## 2021-01-23 ENCOUNTER — Other Ambulatory Visit: Payer: Self-pay

## 2021-01-23 DIAGNOSIS — F4323 Adjustment disorder with mixed anxiety and depressed mood: Secondary | ICD-10-CM | POA: Diagnosis not present

## 2021-01-23 NOTE — Progress Notes (Signed)
   THERAPIST PROGRESS NOTE  Session Time: 1pm  Participation Level: Active  Behavioral Response: Casual and NeatAlertpleasant  Type of Therapy: Individual Therapy  Treatment Goals addressed: Anxiety and Coping  Interventions: Supportive  Summary: Dana Manning is a 30 y.o. female who present in pleasant mood but tearful at times when discussing work related stress. Pt reports voicing her concerns to her manager about work challenges but concerned she may receive backlash from doing so. Pt reports she is unhappy in her position and is looking for new career opportunities. Pt reports she is looking forward to going on vacation next week.  Suicidal/Homicidal: Pt denies SI/HI no plan or intent to harm self or others reported.  Therapist Response:  CSW actively listened as pt discussed  work related stressors she has been encountering. CSW discussed with pt assertiveness techniques to assist with improving communication at job. CSW probed for feedback on pros and cons of job. CSW briefly discussed with pt emotion regulation skills, will further discuss during next session.  Plan: Return again in 2 weeks.  Diagnosis: Axis I: Adjustment disorder with mixed anxiety and depressed mood    Axis II: No diagnosis    Yvette Rack, LCSW 01/23/2021

## 2021-02-14 ENCOUNTER — Ambulatory Visit (INDEPENDENT_AMBULATORY_CARE_PROVIDER_SITE_OTHER): Payer: No Typology Code available for payment source | Admitting: Family

## 2021-02-14 ENCOUNTER — Other Ambulatory Visit: Payer: Self-pay | Admitting: Family

## 2021-02-14 ENCOUNTER — Other Ambulatory Visit: Payer: Self-pay

## 2021-02-14 ENCOUNTER — Encounter: Payer: Self-pay | Admitting: Family

## 2021-02-14 VITALS — BP 100/76 | HR 77 | Temp 98.2°F | Ht 61.0 in | Wt 144.8 lb

## 2021-02-14 DIAGNOSIS — G8929 Other chronic pain: Secondary | ICD-10-CM

## 2021-02-14 DIAGNOSIS — R197 Diarrhea, unspecified: Secondary | ICD-10-CM

## 2021-02-14 DIAGNOSIS — M545 Low back pain, unspecified: Secondary | ICD-10-CM | POA: Diagnosis not present

## 2021-02-14 LAB — CBC WITH DIFFERENTIAL/PLATELET
Basophils Absolute: 0 10*3/uL (ref 0.0–0.1)
Basophils Relative: 0.4 % (ref 0.0–3.0)
Eosinophils Absolute: 0.3 10*3/uL (ref 0.0–0.7)
Eosinophils Relative: 4.1 % (ref 0.0–5.0)
HCT: 34 % — ABNORMAL LOW (ref 36.0–46.0)
Hemoglobin: 10.9 g/dL — ABNORMAL LOW (ref 12.0–15.0)
Lymphocytes Relative: 36.4 % (ref 12.0–46.0)
Lymphs Abs: 2.4 10*3/uL (ref 0.7–4.0)
MCHC: 32.2 g/dL (ref 30.0–36.0)
MCV: 75.5 fl — ABNORMAL LOW (ref 78.0–100.0)
Monocytes Absolute: 0.6 10*3/uL (ref 0.1–1.0)
Monocytes Relative: 9.7 % (ref 3.0–12.0)
Neutro Abs: 3.3 10*3/uL (ref 1.4–7.7)
Neutrophils Relative %: 49.4 % (ref 43.0–77.0)
Platelets: 485 10*3/uL — ABNORMAL HIGH (ref 150.0–400.0)
RBC: 4.5 Mil/uL (ref 3.87–5.11)
RDW: 17.3 % — ABNORMAL HIGH (ref 11.5–15.5)
WBC: 6.7 10*3/uL (ref 4.0–10.5)

## 2021-02-14 LAB — COMPREHENSIVE METABOLIC PANEL
ALT: 13 U/L (ref 0–35)
AST: 18 U/L (ref 0–37)
Albumin: 4.3 g/dL (ref 3.5–5.2)
Alkaline Phosphatase: 45 U/L (ref 39–117)
BUN: 8 mg/dL (ref 6–23)
CO2: 24 mEq/L (ref 19–32)
Calcium: 9.6 mg/dL (ref 8.4–10.5)
Chloride: 105 mEq/L (ref 96–112)
Creatinine, Ser: 0.69 mg/dL (ref 0.40–1.20)
GFR: 117.61 mL/min (ref >60.00)
Glucose, Bld: 93 mg/dL (ref 70–99)
Potassium: 3.7 mEq/L (ref 3.5–5.1)
Sodium: 138 mEq/L (ref 135–145)
Total Bilirubin: 1.1 mg/dL (ref 0.2–1.2)
Total Protein: 7.3 g/dL (ref 6.0–8.3)

## 2021-02-14 LAB — LIPASE: Lipase: 21 U/L (ref 11.0–59.0)

## 2021-02-14 LAB — AMYLASE: Amylase: 37 U/L (ref 27–131)

## 2021-02-14 NOTE — Patient Instructions (Signed)
Please schedule a follow up with your orthopedist to discuss your continued back pain; you can call Dr. Junius Roads and schedule your own appointment.  You can also reach out to [facility] to get a copy of your note to get clarification on what restrictions you need in place.

## 2021-02-14 NOTE — Progress Notes (Signed)
Dana Manning is a 30 y.o. female with the following history as recorded in Epic[employer]:  Patient Active Problem List   Diagnosis Date Noted   Intramural leiomyoma of uterus 05/14/2016   Bloating symptom 09/21/2015   Epigastric pain 09/21/2015   Excessive and frequent menstruation with regular cycle 09/21/2015    Current Outpatient Medications  Medication Sig Dispense Refill   ondansetron (ZOFRAN ODT) 4 MG disintegrating tablet Take 1 tablet (4 mg total) by mouth every 8 (eight) hours as needed for nausea or vomiting. 20 tablet 0   No current facility-administered medications for this visit.    Allergies: Patient has no known allergies.  Past Medical History:  Diagnosis Date   Anemia    09-07-2017 per pt mild   Endometrial polyp    Metrorrhagia     Past Surgical History:  Procedure Laterality Date   APPENDECTOMY  2006   DILATATION & CURETTAGE/HYSTEROSCOPY WITH MYOSURE N/A 06/20/2016   Procedure: DILATATION & CURETTAGE/HYSTEROSCOPY WITH MYOSURE;  Surgeon: Terrance Mass, MD;  Location: [facility];  Service: Gynecology;  Laterality: N/A;     DILATATION & CURETTAGE/HYSTEROSCOPY WITH MYOSURE N/A 09/18/2017   Procedure: DILATATION & CURETTAGE/HYSTEROSCOPY WITH MYOSURE;  Surgeon: [name], MD;  Location: [facility];  Service: Gynecology;  Laterality: N/A;  requests 7:30am OR time Requests one hour   TONSILLECTOMY  age 6    Family History  Problem Relation Age of Onset   Cancer Maternal Grandmother        SMALL INTESTINE-    Cancer Maternal Grandfather        PROSTATE- LYMPHOMA    Social History   Tobacco Use   Smoking status: Never   Smokeless tobacco: Never  Substance Use Topics   Alcohol use: No    Subjective:  Presents today as a new patient; has had diarrhea x 2 weeks; seems to be getting better in the past 24 hours; did travel prior to onset of symptoms; has had some nausea; able to keep fluids down; no blood in stool, no abdominal pain, no  vomiting;   LMP- July 19  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 Junius Roads in orthopedics in 2021 due to back issues but did not follow up as instructed after completing PT;       Objective:  Vitals:   02/14/21 0909  BP: 100/76  Pulse: 77  Temp: 98.2 F (36.8 C)  TempSrc: Oral  SpO2: 99%  Weight: 144 lb 12.8 oz (65.7 kg)  Height: _0  (1.549 m)    General: Well developed, well nourished, in no acute distress  Skin : Warm and dry.  Head: Normocephalic and atraumatic  Lungs: Respirations unlabored; clear to auscultation bilaterally without wheeze, rales, rhonchi  CVS exam: normal rate and regular rhythm.  Abdomen: Soft; nontender; nondistended; normoactive bowel sounds; no masses or hepatosplenomegaly  Musculoskeletal: No deformities; no active joint inflammation  Extremities: No edema, cyanosis, clubbing  Vessels: Symmetric bilaterally  Neurologic: Alert and oriented; speech intact; face symmetrical; moves all extremities well; CNII-XII intact without focal deficit   Assessment:  1. Acute diarrhea   2. Chronic low back  pain without sciatica, unspecified back pain laterality     Plan:  Symptoms are improving; update labs and stool culture today; follow up to be determined; Encouraged to go back to her orthopedist- his notes indicate that he wanted to consider MRI with persisting symptoms; in the interim, she is given work note allowing her to sit for up to 4 hours of a 12 hour shift; further work restrictions will need to come from her orthopedist.  This visit occurred during the SARS-CoV-2 public health emergency.  Safety protocols were in place, including  screening questions prior to the visit, additional usage of staff PPE, and extensive cleaning of exam room while observing appropriate contact time as indicated for disinfecting solutions.    No follow-ups on file.  Orders Placed This Encounter  Procedures   CBC with Differential/Platelet   Comp Met (CMET)   Amylase   Lipase   GI Profile, Stool, PCR    Requested Prescriptions    No prescriptions requested or ordered in this encounter

## 2021-02-15 ENCOUNTER — Encounter: Payer: Self-pay | Admitting: Family

## 2021-02-15 ENCOUNTER — Other Ambulatory Visit: Payer: Self-pay | Admitting: Family

## 2021-02-15 DIAGNOSIS — D509 Iron deficiency anemia, unspecified: Secondary | ICD-10-CM

## 2021-02-18 ENCOUNTER — Other Ambulatory Visit: Payer: Self-pay | Admitting: Family

## 2021-02-18 LAB — GI PROFILE, STOOL, PCR
Adenovirus F 40/41: NOT DETECTED
Astrovirus: NOT DETECTED
C difficile toxin A/B: NOT DETECTED
Campylobacter: NOT DETECTED
Cryptosporidium: DETECTED — AB
Cyclospora cayetanensis: NOT DETECTED
Entamoeba histolytica: NOT DETECTED
Enteroaggregative E coli: NOT DETECTED
Enteropathogenic E coli: DETECTED — AB
Enterotoxigenic E coli: NOT DETECTED
Giardia lamblia: NOT DETECTED
Norovirus GI/GII: NOT DETECTED
Plesiomonas shigelloides: NOT DETECTED
Rotavirus A: NOT DETECTED
Salmonella: NOT DETECTED
Sapovirus: NOT DETECTED
Shiga-toxin-producing E coli: NOT DETECTED
Shigella/Enteroinvasive E coli: NOT DETECTED
Vibrio cholerae: NOT DETECTED
Vibrio: NOT DETECTED
Yersinia enterocolitica: NOT DETECTED

## 2021-02-18 MED ORDER — CIPROFLOXACIN HCL 500 MG PO TABS: 500.0000 mg | ORAL_TABLET | Freq: Two times a day (BID) | ORAL | 0 refills | Status: AC

## 2021-02-19 ENCOUNTER — Encounter: Payer: Self-pay | Admitting: Family Medicine

## 2021-02-19 ENCOUNTER — Other Ambulatory Visit: Payer: Self-pay

## 2021-02-19 ENCOUNTER — Ambulatory Visit (INDEPENDENT_AMBULATORY_CARE_PROVIDER_SITE_OTHER): Payer: No Typology Code available for payment source | Admitting: Family Medicine

## 2021-02-19 DIAGNOSIS — M545 Low back pain, unspecified: Secondary | ICD-10-CM | POA: Diagnosis not present

## 2021-02-19 DIAGNOSIS — G8929 Other chronic pain: Secondary | ICD-10-CM

## 2021-02-19 NOTE — Progress Notes (Signed)
Office Visit Note   Patient: Dana Manning           Date of Birth: [birth year]           MRN: [id number] Visit Date: 02/19/2021 Requested by: Marrian Salvage, [address] [place],  [place] [address] PCP: Marrian Salvage, FNP  Subjective: Chief Complaint  Patient presents with   Lower Back - Pain    Here to follow up, due to contining pain, wants a note for work to allow her to sit for longer periods of time as standing causes her back pain, did complete PT, does her back exercises as well as works and her trainer accomodates her back pain.    HPI: She is here for follow-up chronic pain.  She completed physical therapy.  Prior to that she completed a round of chiropractic.  She is doing home exercises.  Unfortunately her symptoms are not improving.  Longer she stands, the worst her pain becomes.  Pain is mostly in the left lower back.  She has not had much in the way of sciatica.               ROS:   All other systems were reviewed and are negative.  Objective: Vital Signs: There were no vitals taken for this visit.  Physical Exam:  General:  Alert and oriented, in no acute distress. Pulm:  Breathing unlabored. Psy:  Normal mood, congruent affect.  Back: She has slightly exaggerated lumbar lordosis.  She has negative straight leg raise, she is tight in the hip flexors bilaterally.  No pain with hip rotation.  Lower extremity strength and reflexes remain normal.  She has tenderness in the left lower lumbar paraspinous muscles.  Stork test is equivocal.    Imaging: No results found.  Assessment & Plan: Chronic low back pain with history of spondylolysis -We will proceed with MRI scan.  She will continue working on core strengthening, flexibility. -Work note given to allow sitting for 40% of her work shift.     Procedures: No procedures performed        PMFS History: Patient Active Problem List   Diagnosis Date Noted    Intramural leiomyoma of uterus 05/14/2016   Bloating symptom 09/21/2015   Epigastric pain 09/21/2015   Excessive and frequent menstruation with regular cycle 09/21/2015   Past Medical History:  Diagnosis Date   Anemia    09-07-2017 per pt mild   Endometrial polyp    Metrorrhagia     Family History  Problem Relation Age of Onset   Cancer Maternal Grandmother        SMALL INTESTINE-    Cancer Maternal Grandfather        PROSTATE- LYMPHOMA    Past Surgical History:  Procedure Laterality Date   APPENDECTOMY  2006   DILATATION & CURETTAGE/HYSTEROSCOPY WITH MYOSURE N/A 06/20/2016   Procedure: DILATATION & CURETTAGE/HYSTEROSCOPY WITH MYOSURE;  Surgeon: Terrance Mass, MD;  Location: [facility];  Service: Gynecology;  Laterality: N/A;     DILATATION & CURETTAGE/HYSTEROSCOPY WITH MYOSURE N/A 09/18/2017   Procedure: DILATATION & CURETTAGE/HYSTEROSCOPY WITH MYOSURE;  Surgeon: [name], MD;  Location: [facility];  Service: Gynecology;  Laterality: N/A;  requests 7:30am OR time Requests one hour   TONSILLECTOMY  age 53   Social History   Occupational History   Not on file  Tobacco Use   Smoking status: Never   Smokeless tobacco: Never  Vaping Use  Vaping Use: Never used  Substance and Sexual Activity   Alcohol use: No   Drug use: No   Sexual activity: Not Currently    Partners: Male    Comment: [employer]ST INTERCOURSE- 47, PARTNERS - 6,

## 2021-03-02 ENCOUNTER — Other Ambulatory Visit: Payer: Self-pay

## 2021-03-02 ENCOUNTER — Ambulatory Visit (HOSPITAL_BASED_OUTPATIENT_CLINIC_OR_DEPARTMENT_OTHER)
Admission: RE | Admit: 2021-03-02 | Discharge: 2021-03-02 | Disposition: A | Discharge status: Home / Self Care | Payer: No Typology Code available for payment source | Source: Ambulatory Visit | Attending: Family Medicine | Admitting: Family Medicine

## 2021-03-02 DIAGNOSIS — M545 Low back pain, unspecified: Secondary | ICD-10-CM | POA: Diagnosis not present

## 2021-03-02 DIAGNOSIS — G8929 Other chronic pain: Secondary | ICD-10-CM | POA: Diagnosis present

## 2021-03-04 ENCOUNTER — Telehealth: Payer: Self-pay | Admitting: Family Medicine

## 2021-03-04 NOTE — Telephone Encounter (Signed)
MRI shows mild disc degeneration at L4-5 with early arthritis of the facet joints.  No nerve impingement.  No indication for surgery.  Continue working on core strength and flexibility for long-term back health.

## 2021-03-05 MED ORDER — CELECOXIB 200 MG PO CAPS: 200.0000 mg | ORAL_CAPSULE | Freq: Two times a day (BID) | ORAL | 6 refills | Status: AC | PRN

## 2021-03-05 NOTE — Addendum Note (Signed)
Addended by: Hortencia Pilar on: 03/05/2021 04:40 PM   Modules accepted: Orders

## 2021-08-10 ENCOUNTER — Emergency Department (HOSPITAL_COMMUNITY): Payer: PRIVATE HEALTH INSURANCE

## 2021-08-10 ENCOUNTER — Encounter (HOSPITAL_COMMUNITY): Payer: Self-pay | Admitting: Student

## 2021-08-10 ENCOUNTER — Other Ambulatory Visit: Payer: Self-pay

## 2021-08-10 ENCOUNTER — Emergency Department (HOSPITAL_COMMUNITY)
Admission: EM | Admit: 2021-08-10 | Discharge: 2021-08-10 | Disposition: A | Discharge status: Home / Self Care | Payer: PRIVATE HEALTH INSURANCE | Attending: Emergency Medicine | Admitting: Emergency Medicine

## 2021-08-10 DIAGNOSIS — S0990XA Unspecified injury of head, initial encounter: Secondary | ICD-10-CM | POA: Insufficient documentation

## 2021-08-10 MED ORDER — IBUPROFEN 800 MG PO TABS
800.0000 mg | ORAL_TABLET | Freq: Once | ORAL | Status: AC
  Administered 2021-08-10: 800 mg via ORAL
  Filled 2021-08-10: qty 1

## 2021-08-10 MED ORDER — ONDANSETRON 4 MG PO TBDP: 4.0000 mg | ORAL_TABLET | Freq: Three times a day (TID) | ORAL | 0 refills | Status: AC | PRN

## 2021-08-10 MED ORDER — ONDANSETRON 4 MG PO TBDP
4.0000 mg | ORAL_TABLET | Freq: Once | ORAL | Status: AC
  Administered 2021-08-10: 4 mg via ORAL
  Filled 2021-08-10: qty 1

## 2021-08-10 MED ORDER — ACETAMINOPHEN 325 MG PO TABS
650.0000 mg | ORAL_TABLET | Freq: Once | ORAL | Status: AC
  Administered 2021-08-10: 650 mg via ORAL
  Filled 2021-08-10: qty 2

## 2021-08-10 NOTE — Discharge Instructions (Addendum)
You were seen in the emergency department today following a head injury.  We suspect that you have a concussion, otherwise known and as a mild traumatic brain injury.  Your  CT scan did not show any new abnormality such as a brain bleed.  We would like you to follow-up with the [facility] concussion clinic, contact information below:  Address: [address], [place], [place] [address] Phone: [phone]  Per [facility] Website:   What to Expect: Evaluations at the [facility] All patients at the [facility] are given an extensive three-part evaluation that includes: a computerized test to measure memory, visual processing speed, and reaction time a test that measures the systems that integrate movement, balance, and vision an in-depth review of a detailed symptoms checklist for signs of concussion The evaluation process is critical for the treatment and recovery of a concussed patient as no two concussions are alike. Thankfully, the diagnostic tools that trained professionals use can help to better manage head injuries.  Part of the technology the doctors and staff at [facility] use in their assessments is a computerized examination called ImPACT. This tool uses six tasks to measure memory, visual processing speed, and reaction time. By analyzing the results of the examination and comparing them to average responses or a baseline score for a patient, our staff can make an informed judgment about the patients cognitive functions. In addition to the ImPACT examination, patients are given a Vestibular Ocular Motor Screening test. This is a simple and painless test that focuses on the systems that integrate a patients movement, balance, and vision.  These tests are used in conjunction with a thorough review of a detailed symptoms checklist to complete the patients evaluation and develop a treatment plan.  You do not need a referral,  and you can book an appointment online. Our Concussion Hotline is staffed by trained professionals during our regular office hours: Monday - Thursday from 7:30 AM to 4:30 PM, and Fridays from 7:30 AM to 12:00 PM, and the number to call is [phone]. The Concussion Clinic team meets with patients at our [facility] office. Call today.  Prescriptions:  Take zofran every 8 hours as needed for nausea/vomiting. We have prescribed you new medication(s) today. Discuss the medications prescribed today with your pharmacist as they can have adverse effects and interactions with your other medicines including over the counter and prescribed medications. Seek medical evaluation if you start to experience new or abnormal symptoms after taking one of these medicines, seek care immediately if you start to experience difficulty breathing, feeling of your throat closing, facial swelling, or rash as these could be indications of a more serious allergic reaction    Further ED Instructions:   Please call and follow-up within the concussion clinic as well as your primary care provider within the next 3 to 5 days.  In the meantime we would like you to avoid strenuous/over exertional activities such as sports or running.  Please avoid excess screen time utilizing cell phones, computers, or the TV.  Please avoid activities that require significant amount of concentration.  Please try to rest as much as possible.  Please take Tylenol and/or Motrin per over-the-counter dosing instructions for any continued discomfort.  Return to the ER for new or worsening symptoms including but not limited to new or worsening headache, sudden change in headache, vision change, seizure activity, trouble walking, trouble moving or feeling a certain side of your body, or  any other concerns that you may have.
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
## 2022-08-03 IMAGING — CT CT ABD-PELV W/ CM
2 of 4 series · 16 of 46 positions shown, 18 images · IV contrast (omnipaque)
Comparison: Ultrasound 01/03/2021, ultrasound 09/13/2020

CLINICAL DATA: Fever abdominal pain

EXAM:
CT ABDOMEN AND PELVIS WITH CONTRAST
TECHNIQUE: Multidetector CT imaging of the abdomen and pelvis was performed
using the standard protocol following bolus administration of
intravenous contrast.
CONTRAST:  100mL OMNIPAQUE IOHEXOL 300 MG/ML  SOLN

[Series 2: axial st · axial · 0.87mm/px · z∈[+707,+1112]mm · 13 of 89 slices shown, 15 images]
[im 4/89  soft-tissue]
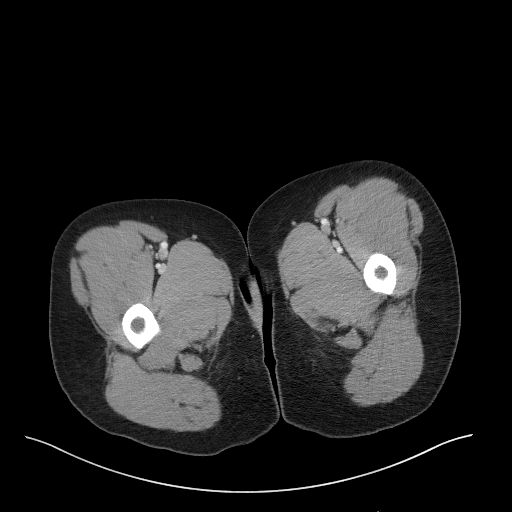
[im 4/89  bone]
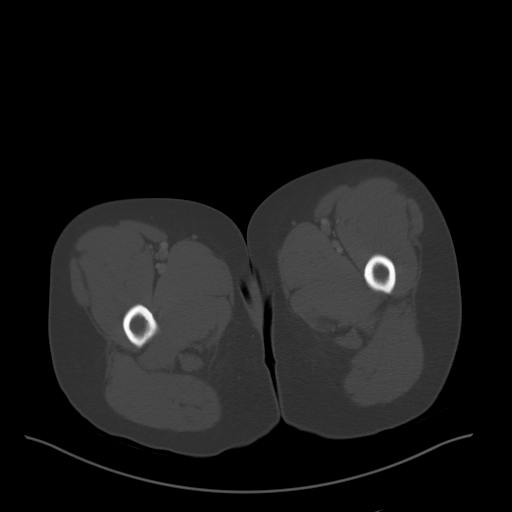
[im 12/89  soft-tissue]
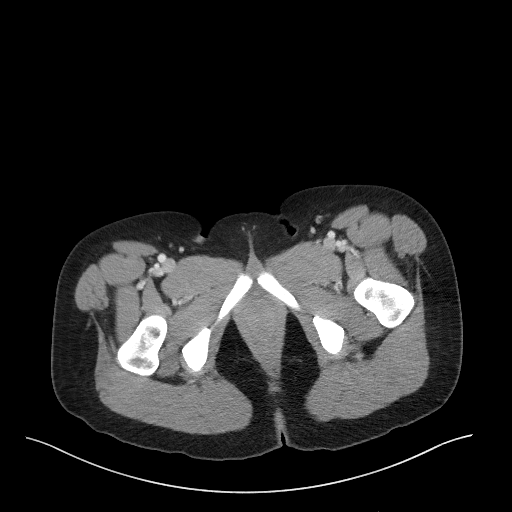
[im 19/89  soft-tissue]
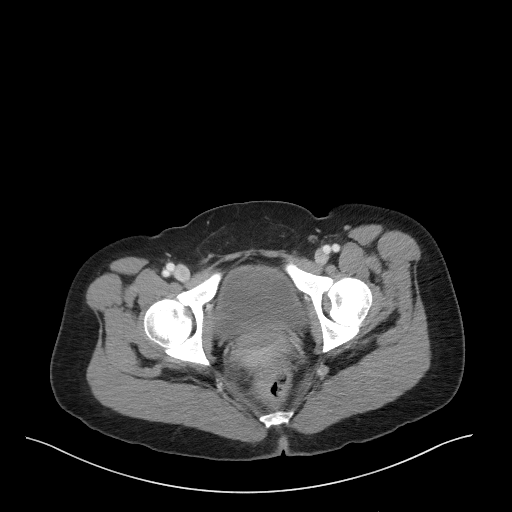
[im 26/89  soft-tissue]
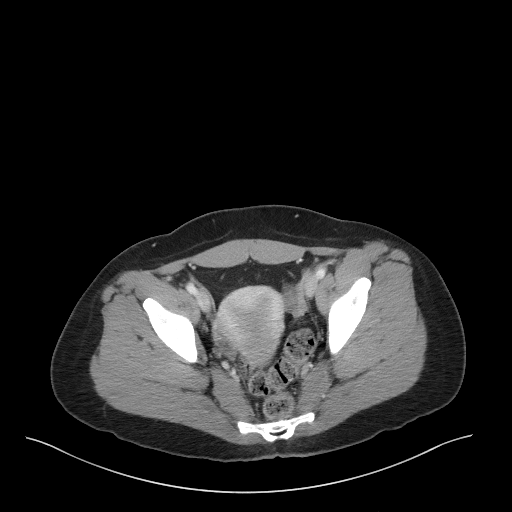
[im 30/89  soft-tissue]
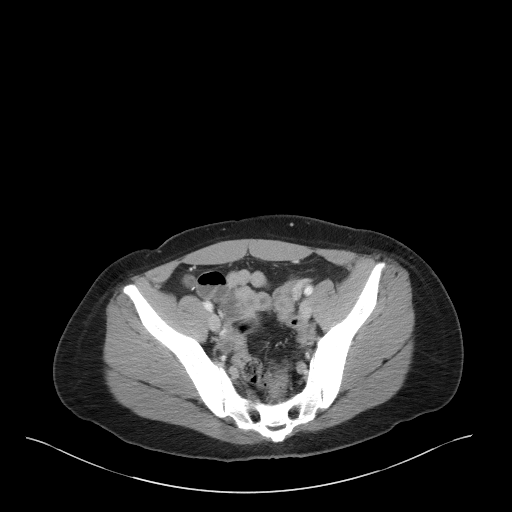
[im 37/89  soft-tissue]
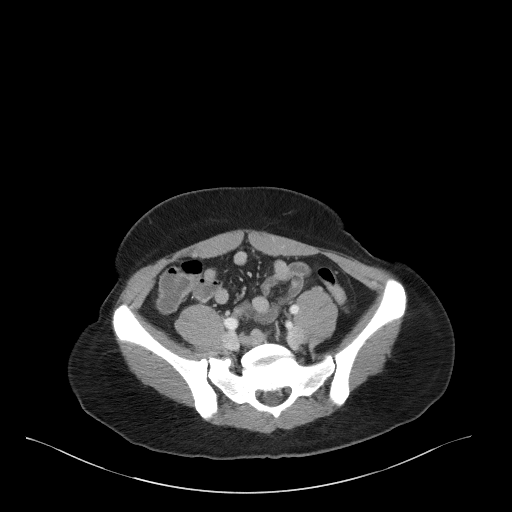
[im 45/89  soft-tissue]
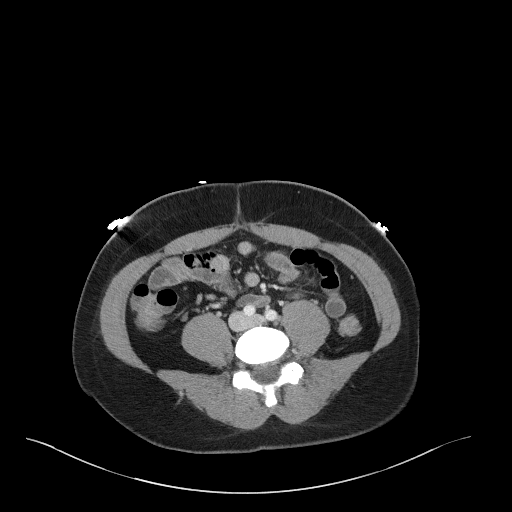
[im 52/89  soft-tissue]
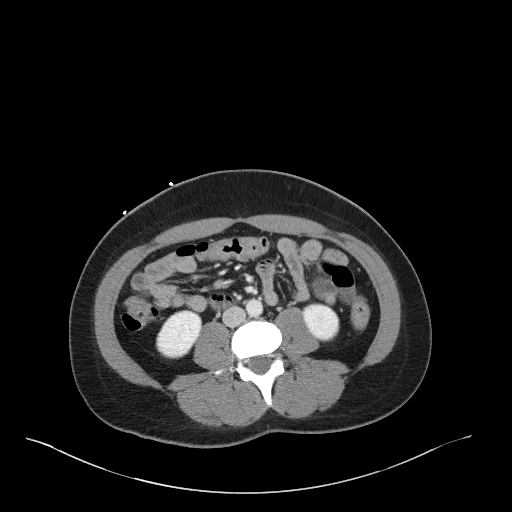
[im 59/89  soft-tissue]
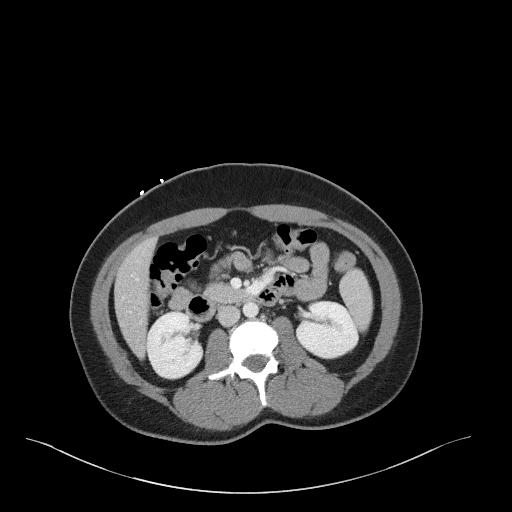
[im 59/89  bone]
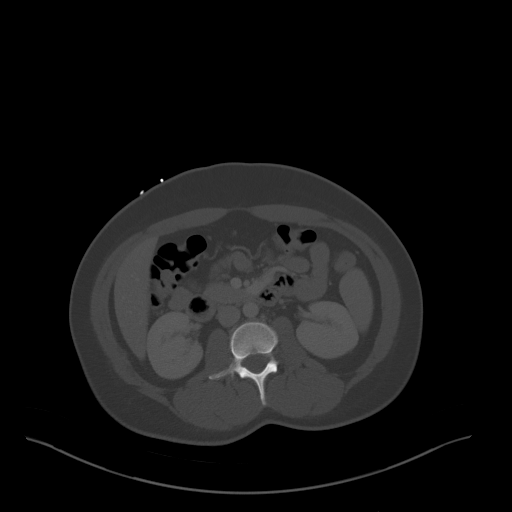
[im 63/89  soft-tissue]
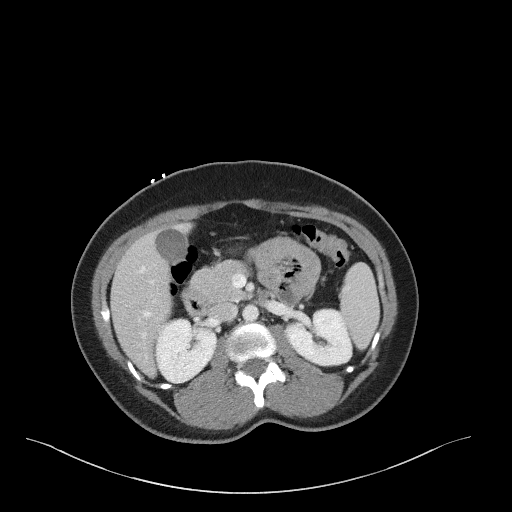
[im 70/89  soft-tissue]
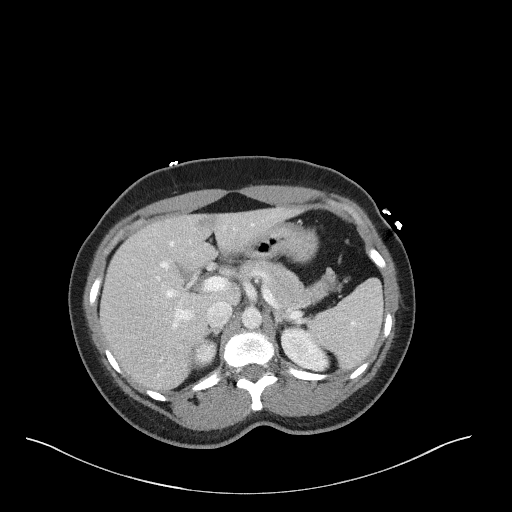
[im 78/89  soft-tissue]
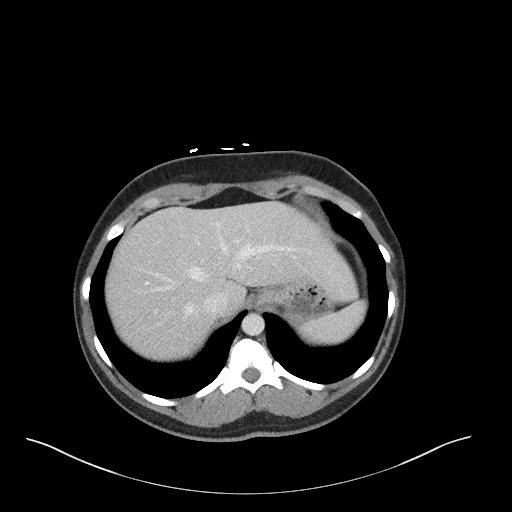
[im 85/89  soft-tissue]
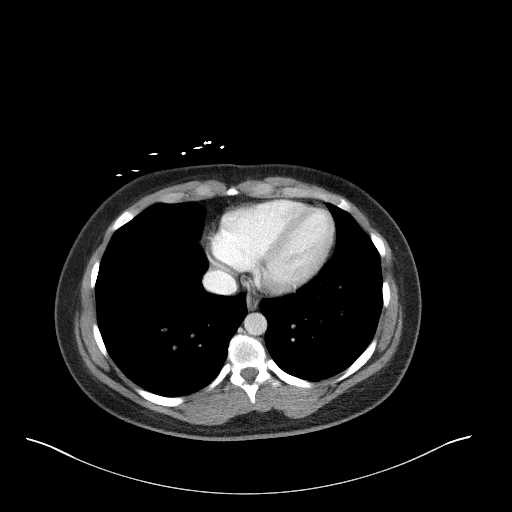

[Series 5: coronal st · coronal · 0.66mm/px · 3 of 99 slices shown]
[im 33/99  soft-tissue]
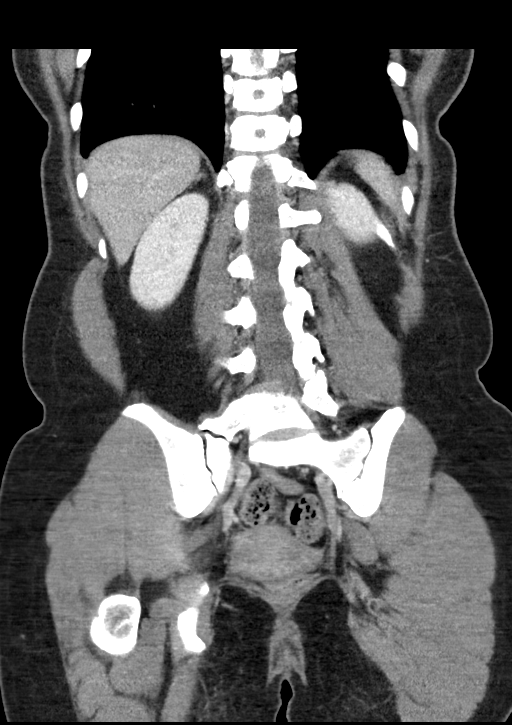
[im 44/99  soft-tissue]
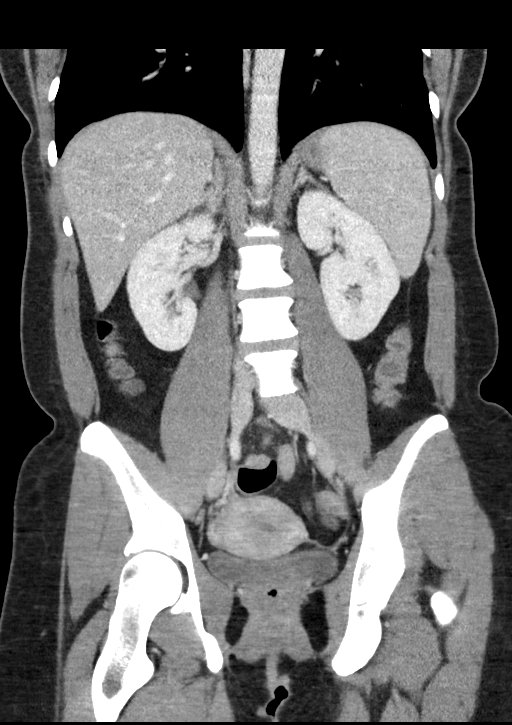
[im 55/99  soft-tissue]
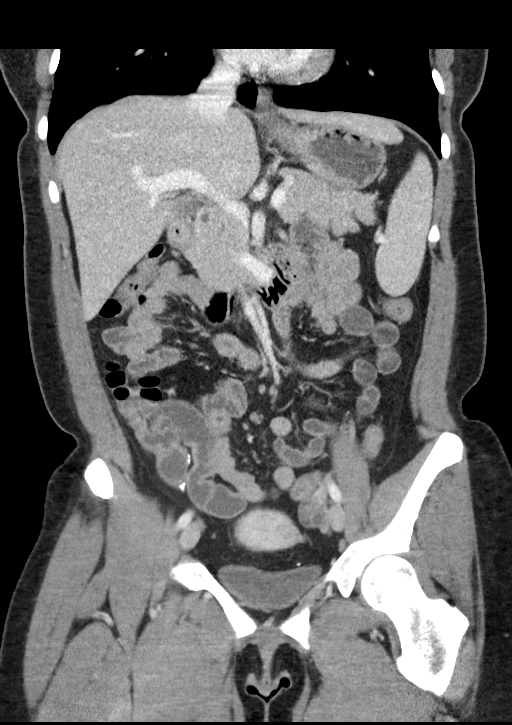

[16 of 46 positions shown; findings below may reference images not displayed]

FINDINGS: Lower chest: No acute abnormality.

Hepatobiliary: Focal fat infiltration near falciform ligament felt
to correspond to the echogenic mass on ultrasound. No gallstones,
gallbladder wall thickening, or biliary dilatation.

Pancreas: Unremarkable. No pancreatic ductal dilatation or
surrounding inflammatory changes.

Spleen: Normal in size without focal abnormality.

Adrenals/Urinary Tract: Adrenal glands are unremarkable. Kidneys are
normal, without renal calculi, focal lesion, or hydronephrosis.
Bladder is unremarkable.

Stomach/Bowel: Stomach is within normal limits. Status post
appendectomy. No evidence of bowel wall thickening, distention, or
inflammatory changes.

Vascular/Lymphatic: No significant vascular findings are present. No
enlarged abdominal or pelvic lymph nodes.

Reproductive: Probable arcuate configuration of the uterus.
Prominent endometrial stripe measuring up to 19 mm. No adnexal mass.

Other: No abdominal wall hernia or abnormality. No abdominopelvic
ascites.

Musculoskeletal: No acute or significant osseous findings.
IMPRESSION: 1. No CT evidence for acute intra-abdominal or pelvic abnormality.
2. Prominent endometrial complex, refer to prior ultrasound
09/13/2020.
